# Patient Record
Sex: Male | Born: 1980 | Race: White | Hispanic: No | Marital: Single | Smoking: Never smoker
Health system: Southern US, Community
[De-identification: ages and names within clinical notes are randomized; demographics above are authoritative.]

## PROBLEM LIST (undated history)

## (undated) DIAGNOSIS — A630 Anogenital (venereal) warts: Secondary | ICD-10-CM

## (undated) DIAGNOSIS — F329 Major depressive disorder, single episode, unspecified: Secondary | ICD-10-CM

## (undated) DIAGNOSIS — F32A Depression, unspecified: Secondary | ICD-10-CM

## (undated) DIAGNOSIS — Z973 Presence of spectacles and contact lenses: Secondary | ICD-10-CM

## (undated) DIAGNOSIS — C801 Malignant (primary) neoplasm, unspecified: Secondary | ICD-10-CM

## (undated) DIAGNOSIS — F419 Anxiety disorder, unspecified: Secondary | ICD-10-CM

## (undated) HISTORY — DX: Major depressive disorder, single episode, unspecified: F32.9

## (undated) HISTORY — DX: Depression, unspecified: F32.A

---

## 2011-06-28 ENCOUNTER — Ambulatory Visit (INDEPENDENT_AMBULATORY_CARE_PROVIDER_SITE_OTHER): Payer: BC Managed Care – PPO | Admitting: Emergency Medicine

## 2011-06-28 VITALS — BP 133/79 | HR 83 | Temp 98.3°F | Resp 17 | Ht 70.5 in | Wt 136.0 lb

## 2011-06-28 DIAGNOSIS — A6000 Herpesviral infection of urogenital system, unspecified: Secondary | ICD-10-CM

## 2011-06-28 MED ORDER — VALACYCLOVIR HCL 1 G PO TABS: 1000.0000 mg | ORAL_TABLET | Freq: Every day | ORAL | Status: AC

## 2011-06-28 NOTE — Progress Notes (Signed)
     Patient Name: Arthur Park Date of Birth: 1980/11/29 Medical Record Number: [id number] Gender: male Date of Encounter: 06/28/2011  History of Present Illness:  Arthur Park is a 31 y.o. very pleasant male patient who presents with the following:  Perianal rash that is painful.  First noticed today.  No other rash or complaint.  Was tested for HIV and RPR in April and negative.  Practices anoreptive sex.  Recently had unprotected sex with a new partner.  There is no problem list on file for this patient.  No past medical history on file. No past surgical history on file. History  Substance Use Topics  . Smoking status: Never Smoker   . Smokeless tobacco: Not on file  . Alcohol Use: Not on file   No family history on file. No Known Allergies  Medication list has been reviewed and updated.  Prior to Admission medications   Medication Sig Start Date End Date Taking? Authorizing Provider  citalopram (CELEXA) 40 MG tablet Take 40 mg by mouth daily.   Yes Historical Provider, MD  valACYclovir (VALTREX) 1000 MG tablet Take 1 tablet (1,000 mg total) by mouth daily. 06/28/11 06/27/12  Phillips Odor, MD    Review of Systems:  As per HPI, otherwise negative.    Physical Examination: Filed Vitals:   06/28/11 1341  BP: 133/79  Pulse: 83  Temp: 98.3 F (36.8 C)  Resp: 17   Filed Vitals:   06/28/11 1341  Height: 5' 10.5" (1.791 m)  Weight: 136 lb (61.689 kg)   Body mass index is 19.24 kg/(m^2). Ideal Body Weight: Weight in (lb) to have BMI = 25: 176.4   GEN: WDWN, NAD, Non-toxic, Alert & Oriented x 3 HEENT: Atraumatic, Normocephalic.  Ears and Nose: No external deformity. EXTR: No clubbing/cyanosis/edema NEURO: Normal gait.  PSYCH: Normally interactive. Conversant. Not depressed or anxious appearing.  Calm demeanor.  Rectal:  Multiple discrete erythematous lesions in kissing pattern in perianal area   Assessment and Plan: Herpes  genitalis Valtrex Follow up as needed  Carmelina Dane, MD

## 2011-06-28 NOTE — Patient Instructions (Signed)
Genital Herpes Genital herpes is a sexually transmitted disease. This means that it is a disease passed by having sex with an infected person. There is no cure for genital herpes. The time between attacks can be months to years. The virus may live in a person but produce no problems (symptoms). This infection can be passed to a baby as it travels down the birth canal (vagina). In a newborn, this can cause central nervous system damage, eye damage, or even death. The virus that causes genital herpes is usually HSV-2 virus. The virus that causes oral herpes is usually HSV-1. The diagnosis (learning what is wrong) is made through culture results. SYMPTOMS  Usually symptoms of pain and itching begin a few days to a week after contact. It first appears as small blisters that progress to small painful ulcers which then scab over and heal after several days. It affects the outer genitalia, birth canal, cervix, penis, anal area, buttocks, and thighs. HOME CARE INSTRUCTIONS   Keep ulcerated areas dry and clean.   Take medications as directed. Antiviral medications can speed up healing. They will not prevent recurrences or cure this infection. These medications can also be taken for suppression if there are frequent recurrences.   While the infection is active, it is contagious. Avoid all sexual contact during active infections.   Condoms may help prevent spread of the herpes virus.   Practice safe sex.   Wash your hands thoroughly after touching the genital area.   Avoid touching your eyes after touching your genital area.   Inform your caregiver if you have had genital herpes and become pregnant. It is your responsibility to insure a safe outcome for your baby in this pregnancy.   Only take over-the-counter or prescription medicines for pain, discomfort, or fever as directed by your caregiver.  SEEK MEDICAL CARE IF:   You have a recurrence of this infection.   You do not respond to medications and  are not improving.   You have new sources of pain or discharge which have changed from the original infection.   You have an oral temperature above 102 F (38.9 C).   You develop abdominal pain.   You develop eye pain or signs of eye infection.  Document Released: 12/29/1999 Document Revised: 12/20/2010 Document Reviewed: 01/18/2009 Exit[employer] Patient Information 2012 [employer]. 

## 2011-07-01 ENCOUNTER — Telehealth: Payer: Self-pay | Admitting: *Deleted

## 2011-07-01 NOTE — Telephone Encounter (Signed)
Pt would like lab results.  Can you review labs?

## 2012-08-27 ENCOUNTER — Ambulatory Visit (INDEPENDENT_AMBULATORY_CARE_PROVIDER_SITE_OTHER): Payer: BC Managed Care – PPO | Admitting: Family Medicine

## 2012-08-27 VITALS — BP 132/82 | HR 107 | Temp 98.3°F | Resp 16 | Ht 70.0 in | Wt 148.0 lb

## 2012-08-27 DIAGNOSIS — Z2089 Contact with and (suspected) exposure to other communicable diseases: Secondary | ICD-10-CM

## 2012-08-27 DIAGNOSIS — K6289 Other specified diseases of anus and rectum: Secondary | ICD-10-CM

## 2012-08-27 DIAGNOSIS — Z202 Contact with and (suspected) exposure to infections with a predominantly sexual mode of transmission: Secondary | ICD-10-CM

## 2012-08-27 DIAGNOSIS — K629 Disease of anus and rectum, unspecified: Secondary | ICD-10-CM

## 2012-08-27 DIAGNOSIS — Z7251 High risk heterosexual behavior: Secondary | ICD-10-CM

## 2012-08-27 NOTE — Progress Notes (Signed)
  Subjective:    Patient ID: Arthur Park, male    DOB: 1980/04/22, 32 y.o.   MRN: [id number]  HPI Arthur Park is a 32 y.o. male  Showering earlier after workout - cardio only. No straining.  Noticed bumps in anal area. Not sore. No heavy lifting. Possible herpes last June - different bumps then - itchy. Took valtrex until negative HSV test.  No recent illness.   3 sexual partners in past year, same partner until April of this year - some unprotected intercourse then.. Male partners only. Normal/negative HIV, RPR, chlamydia/gonorrhea June 9th.   2 new partners since, with anal intercourse, but condoms each time. Negative gonorrhea/chlamydia test in July when had testicular pain - resolved.   English professor.  Nonsmoker. Rare alcohol.   Review of Systems  Constitutional: Negative for fever, chills, diaphoresis, activity change and unexpected weight change.  Gastrointestinal: Negative for vomiting, abdominal pain, constipation (few days past week. last BM - today - hard stool. slight straining. ), blood in stool, abdominal distention, anal bleeding and rectal pain.  Genitourinary: Negative for dysuria, frequency, discharge, penile swelling, scrotal swelling, difficulty urinating, genital sores and testicular pain.       Objective:   Physical Exam  Vitals reviewed. HENT:  Head: Normocephalic and atraumatic.  Pulmonary/Chest: Effort normal.  Abdominal: Hernia confirmed negative in the right inguinal area and confirmed negative in the left inguinal area.  Genitourinary: Testes normal and penis normal. Rectal exam shows no external hemorrhoid, no internal hemorrhoid and no fissure. Right testis shows no tenderness. Left testis shows no tenderness. No penile erythema or penile tenderness. No discharge found.     Skin: Skin is warm and dry. No rash noted.  Psychiatric: He has a normal mood and affect. His behavior is normal.   Verbal consent obtained after any  questions were answered for bx/removal of one of perianal lesions  Area cleansed with Betadine x2, small tag vs verrucal lesion  At 3 oclock grasped with forceps, removed with iris scissors flat to skin. Minimal ooze of blood - hemostatic with pressure only.  No complications.    Assessment & Plan:  Arthur Park is a 32 y.o. male  Problems related to high-risk sexual behavior, with new partners, and prior unprotected intercourse. Plan: HIV antibody, RPR, GC/Chlamydia Probe Amp, Hepatitis C antibody, HSV(herpes simplex vrs) 1+2 ab-IgG, Hepatitis B surface antibody, Hepatitis B surface antigen.  Perianal lesions - skin tags possible but concerning for condyloma. Biopsy/1 lesion removed and sent for Dermatology pathology, if condyloma, will discuss treatment options.  Contact precautions and safer sex practices discussed.   Meds ordered this encounter  Medications  . buPROPion (WELLBUTRIN XL) 300 MG 24 hr tablet    Sig: Take 300 mg by mouth daily.  Marland Kitchen gabapentin (NEURONTIN) 300 MG capsule    Sig: Take 300 mg by mouth 3 (three) times daily.   Patient Instructions  You should receive a call or letter about your lab results within the next week to 10 days.  To look up more info on your condition, go to the website [email or web address], then on patient resources - select UPTODATE, but we can discuss diagnosis and treatment further once results have returned.  Return to the clinic or go to the nearest emergency room if any of your symptoms worsen or new symptoms occur.

## 2012-08-27 NOTE — Patient Instructions (Signed)
You should receive a call or letter about your lab results within the next week to 10 days.  To look up more info on your condition, go to the website [email or web address], then on patient resources - select UPTODATE, but we can discuss diagnosis and treatment further once results have returned.  Return to the clinic or go to the nearest emergency room if any of your symptoms worsen or new symptoms occur.

## 2012-08-29 LAB — HIV ANTIBODY (ROUTINE TESTING W REFLEX): HIV: NONREACTIVE

## 2012-08-29 LAB — HEPATITIS C ANTIBODY: HCV Ab: NEGATIVE

## 2012-08-29 LAB — HEPATITIS B SURFACE ANTIBODY, QUANTITATIVE: Hepatitis B-Post: 575 m[IU]/mL

## 2012-08-31 LAB — HSV(HERPES SIMPLEX VRS) I + II AB-IGG: HSV 1 Glycoprotein G Ab, IgG: 0.33 IV

## 2012-09-07 ENCOUNTER — Other Ambulatory Visit: Payer: Self-pay | Admitting: Family Medicine

## 2012-09-07 ENCOUNTER — Telehealth: Payer: Self-pay | Admitting: Radiology

## 2012-09-07 DIAGNOSIS — A630 Anogenital (venereal) warts: Secondary | ICD-10-CM

## 2012-09-07 MED ORDER — IMIQUIMOD 5 % EX CREA: TOPICAL_CREAM | CUTANEOUS | Status: DC

## 2012-09-07 NOTE — Telephone Encounter (Signed)
Patient called on his cell [phone] you can call any time to speak to him, if you need me to call I can.

## 2012-09-17 ENCOUNTER — Telehealth: Payer: Self-pay

## 2012-09-17 NOTE — Telephone Encounter (Signed)
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 He now has an area above his eyelid (looks like a wart ) he wants to know if he can get these at his eyelid.

## 2012-09-17 NOTE — Telephone Encounter (Signed)
Pt is calling to speak to Dr Neva Seat he has follow up question about his last visit  Call back number is [phone]

## 2012-09-18 NOTE — Telephone Encounter (Signed)
Less likely in this area, but if new area of concern, would recommend rtc to check this as could just be skin tag.

## 2012-09-19 NOTE — Telephone Encounter (Signed)
Spoke with pt he saw an optometrist and it seems to be a mucus plug.

## 2012-10-01 ENCOUNTER — Telehealth: Payer: Self-pay

## 2012-10-01 NOTE — Telephone Encounter (Signed)
Noted. Called pt - Aldara is causing redness and irritation. Does not think is receeding, but hard to tell if improving d/t redness of area.  Applying aldara overnight 3 times per week and rinsing in am.  Now 3 weeks into tx.  Plans to stop for 1 week , then retr, but then if unable to tolerate - would refer to Derm for other mgt options or in office treatments.

## 2012-10-01 NOTE — Telephone Encounter (Signed)
Patient wants to stop taking Aldara. States that it is making him more irritated and it is not helping.  [phone].

## 2013-01-18 ENCOUNTER — Encounter: Payer: Self-pay | Admitting: Family Medicine

## 2013-01-18 ENCOUNTER — Ambulatory Visit (INDEPENDENT_AMBULATORY_CARE_PROVIDER_SITE_OTHER): Payer: BC Managed Care – PPO | Admitting: Family Medicine

## 2013-01-18 VITALS — BP 128/94 | HR 101 | Temp 98.4°F | Resp 16 | Ht 69.75 in | Wt 146.4 lb

## 2013-01-18 DIAGNOSIS — G4700 Insomnia, unspecified: Secondary | ICD-10-CM

## 2013-01-18 DIAGNOSIS — Z202 Contact with and (suspected) exposure to infections with a predominantly sexual mode of transmission: Secondary | ICD-10-CM

## 2013-01-18 DIAGNOSIS — Z2089 Contact with and (suspected) exposure to other communicable diseases: Secondary | ICD-10-CM

## 2013-01-18 DIAGNOSIS — A630 Anogenital (venereal) warts: Secondary | ICD-10-CM

## 2013-01-18 NOTE — Progress Notes (Signed)
Subjective:    Patient ID: Arthur Park, male    DOB: [birth year], 33 y.o.   MRN: [id number]  Arthur Park is a 33 y.o. male "Arthur Park"  See last ov 08/27/12, then phone calls since.  Had STI testing at that ov: Results for orders placed in visit on 08/27/12  GC/CHLAMYDIA PROBE AMP      Result Value Range   CT Probe RNA NEGATIVE     GC Probe RNA NEGATIVE    HIV ANTIBODY (ROUTINE TESTING)      Result Value Range   HIV NON REACTIVE  NON REACTIVE  RPR      Result Value Range   RPR NON REAC  NON REAC  HEPATITIS C ANTIBODY      Result Value Range   HCV Ab NEGATIVE  NEGATIVE  HSV(HERPES SIMPLEX VRS) I + II AB-IGG      Result Value Range   HSV 1 Glycoprotein G Ab, IgG 0.33     HSV 2 Glycoprotein G Ab, IgG <0.10    HEPATITIS B SURFACE ANTIBODY, QUANTITATIVE      Result Value Range   Hepatitis B-Post 575.0    HEPATITIS B SURFACE ANTIGEN      Result Value Range   Hepatitis B Surface Ag NEGATIVE  NEGATIVE    Diagnosed with perianal condyloma, rx Aldara, had some irritation - instructed to stop for 1 weeks then try restart on phone msg 10/01/12.   Here for follow up. Took Aldara for total of 16 weeks (4 weeks, 1 week break, 8 weeks  Few day break d/t redness, then another 4 weeks). Finished around Christmas. Last checked area few days ago - feels like outside is ok, but sees some areas closer to anus.  Not sure if cream made it to those internal ones beyond 4 weeks.   Oral sex with one new partner since last STI testing, no semen transmission. No penetrative or anal intercourse. Same sex partner. Would like repeat testing, as above. No new sx's, no penile d/c, no testicular pain, no anal bleeding or d/c from lesions around anus. Only irritation when taking Aldara. No other new rash.   At end of visit - states not sleeping well past week. . Trouble falling asleep at times, then wakes up at times. Followed by Arthur Park for anxiety - takes Neurontin  3 times per day for  anxiety to help sleep. Melatonin helped get to sleep. Exercises 4 times per week. No afternoon caffeine. No new stressors.   Review of Systems  Constitutional: Negative for fever, chills and unexpected weight change.  Genitourinary: Negative for dysuria, hematuria, difficulty urinating, genital sores, penile pain and testicular pain.  Skin: Positive for rash (anal only. ).       Objective:   Physical Exam  Vitals reviewed. Constitutional: He is oriented to person, place, and time. He appears well-developed and well-nourished. No distress.  HENT:  Head: Normocephalic and atraumatic.  Pulmonary/Chest: Effort normal.  Genitourinary:     Neurological: He is alert and oriented to person, place, and time.  Skin: Skin is warm and dry. No rash noted.  Psychiatric: He has a normal mood and affect. His behavior is normal.       Assessment & Plan:   Arthur Park is a 33 y.o. 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   Insomnia- already on neurontin and wellbutrin for underlying anxiety.  Sleep hygiene reviewed, utd for other info, and if not helping, may need to call his psychiatric provider to discuss dosing change of neurontin or eval.   Exposure to STD - Plan: Hepatitis C antibody, RPR, GC/Chlamydia Amp Probe, Genital, HIV antibody, HSV(herpes simplex vrs) 1+2 ab-IgG - sti testing repeated as new partner since last testing.    Meds ordered this encounter  Medications  . Multiple Vitamins-Minerals (MULTIVITAMIN WITH MINERALS) tablet    Sig: Take 1 tablet by mouth daily.  . vitamin A 32549 UNIT capsule    Sig: Take 10,000 Units by mouth daily.  . Ascorbic Acid (VITAMIN C) 100 MG tablet    Sig: Take 100 mg by mouth daily.  Marland Kitchen zinc gluconate 50 MG tablet    Sig: Take 50 mg by  mouth daily.   Patient Instructions  We will refer you to dermatology to discuss options for the internal rash. You should receive a call or letter about your lab results within the next week to 10 days.  Work on sleep hints as discussed, and see information from uptodate and below. If this persists - return to discuss or let your provider at [facility] know.  To look up more info on your condition, go to the website [email or web address], then on patient resources - select UPTODATE. Under patient resources, select Insomnia Insomnia Insomnia is frequent trouble falling and/or staying asleep. Insomnia can be a long term problem or a short term problem. Both are common. Insomnia can be a short term problem when the wakefulness is related to a certain stress or worry. Long term insomnia is often related to ongoing stress during waking hours and/or poor sleeping habits. Overtime, sleep deprivation itself can make the problem worse. Every little thing feels more severe because you are overtired and your ability to cope is decreased. CAUSES   Stress, anxiety, and depression.  Poor sleeping habits.  Distractions such as TV in the bedroom.  Naps close to bedtime.  Engaging in emotionally charged conversations before bed.  Technical reading before sleep.  Alcohol and other sedatives. They may make the problem worse. They can hurt normal sleep patterns and normal dream activity.  Stimulants such as caffeine for several hours prior to bedtime.  Pain syndromes and shortness of breath can cause insomnia.  Exercise late at night.  Changing time zones may cause sleeping problems (jet lag). It is sometimes helpful to have someone observe your sleeping patterns. They should look for periods of not breathing during the night (sleep apnea). They should also look to see how long those periods last. If you live alone or observers are uncertain, you can also be observed at a sleep clinic where your sleep patterns  will be professionally monitored. Sleep apnea requires a checkup and treatment. Give your caregivers your medical history. Give your caregivers observations your family has made about your sleep.  SYMPTOMS   Not feeling rested in the morning.  Anxiety and restlessness at bedtime.  Difficulty falling and staying asleep. TREATMENT   Your caregiver may prescribe treatment for an underlying medical disorders. Your caregiver can give advice or help if you are using alcohol or other drugs for self-medication. Treatment of underlying problems will usually eliminate insomnia problems.  Medications can be prescribed for short time use. They are generally not recommended for lengthy use.  Over-the-counter sleep medicines are not recommended for lengthy use. They can be habit forming.  You can promote easier sleeping by making lifestyle changes such as:  Using relaxation techniques that help with breathing and reduce muscle tension.  Exercising earlier in the day.  Changing your diet and the time of your last meal. No night time snacks.  Establish a regular time to go to bed.  Counseling can help with stressful problems and worry.  Soothing music and white noise may be helpful if there are background noises you cannot remove.  Stop tedious detailed work at least one hour before bedtime. HOME CARE INSTRUCTIONS   Keep a diary. Inform your caregiver about your progress. This includes any medication side effects. See your caregiver regularly. Take note of:  Times when you are asleep.  Times when you are awake during the night.  The quality of your sleep.  How you feel the next day. This information will help your caregiver care for you.  Get out of bed if you are still awake after 15 minutes. Read or do some quiet activity. Keep the lights down. Wait until you feel sleepy and go back to bed.  Keep regular sleeping and waking hours. Avoid naps.  Exercise regularly.  Avoid  distractions at bedtime. Distractions include watching television or engaging in any intense or detailed activity like attempting to balance the household checkbook.  Develop a bedtime ritual. Keep a familiar routine of bathing, brushing your teeth, climbing into bed at the same time each night, listening to soothing music. Routines increase the success of falling to sleep faster.  Use relaxation techniques. This can be using breathing and muscle tension release routines. It can also include visualizing peaceful scenes. You can also help control troubling or intruding thoughts by keeping your mind occupied with boring or repetitive thoughts like the old concept of counting sheep. You can make it more creative like imagining planting one beautiful flower after another in your backyard garden.  During your day, work to eliminate stress. When this is not possible use some of the previous suggestions to help reduce the anxiety that accompanies stressful situations. MAKE SURE YOU:   Understand these instructions.  Will watch your condition.  Will get help right away if you are not doing well or get worse. Document Released: 12/29/1999 Document Revised: 03/25/2011 Document Reviewed: 01/28/2007 [facility] Patient Information 2014 [place].

## 2013-01-18 NOTE — Patient Instructions (Signed)
We will refer you to dermatology to discuss options for the internal rash. You should receive a call or letter about your lab results within the next week to 10 days.  Work on sleep hints as discussed, and see information from uptodate and below. If this persists - return to discuss or let your provider at [facility] know.  To look up more info on your condition, go to the website [email or web address], then on patient resources - select UPTODATE. Under patient resources, select Insomnia Insomnia Insomnia is frequent trouble falling and/or staying asleep. Insomnia can be a long term problem or a short term problem. Both are common. Insomnia can be a short term problem when the wakefulness is related to a certain stress or worry. Long term insomnia is often related to ongoing stress during waking hours and/or poor sleeping habits. Overtime, sleep deprivation itself can make the problem worse. Every little thing feels more severe because you are overtired and your ability to cope is decreased. CAUSES   Stress, anxiety, and depression.  Poor sleeping habits.  Distractions such as TV in the bedroom.  Naps close to bedtime.  Engaging in emotionally charged conversations before bed.  Technical reading before sleep.  Alcohol and other sedatives. They may make the problem worse. They can hurt normal sleep patterns and normal dream activity.  Stimulants such as caffeine for several hours prior to bedtime.  Pain syndromes and shortness of breath can cause insomnia.  Exercise late at night.  Changing time zones may cause sleeping problems (jet lag). It is sometimes helpful to have someone observe your sleeping patterns. They should look for periods of not breathing during the night (sleep apnea). They should also look to see how long those periods last. If you live alone or observers are uncertain, you can also be observed at a sleep clinic where your sleep patterns will be professionally monitored. Sleep  apnea requires a checkup and treatment. Give your caregivers your medical history. Give your caregivers observations your family has made about your sleep.  SYMPTOMS   Not feeling rested in the morning.  Anxiety and restlessness at bedtime.  Difficulty falling and staying asleep. TREATMENT   Your caregiver may prescribe treatment for an underlying medical disorders. Your caregiver can give advice or help if you are using alcohol or other drugs for self-medication. Treatment of underlying problems will usually eliminate insomnia problems.  Medications can be prescribed for short time use. They are generally not recommended for lengthy use.  Over-the-counter sleep medicines are not recommended for lengthy use. They can be habit forming.  You can promote easier sleeping by making lifestyle changes such as:  Using relaxation techniques that help with breathing and reduce muscle tension.  Exercising earlier in the day.  Changing your diet and the time of your last meal. No night time snacks.  Establish a regular time to go to bed.  Counseling can help with stressful problems and worry.  Soothing music and white noise may be helpful if there are background noises you cannot remove.  Stop tedious detailed work at least one hour before bedtime. HOME CARE INSTRUCTIONS   Keep a diary. Inform your caregiver about your progress. This includes any medication side effects. See your caregiver regularly. Take note of:  Times when you are asleep.  Times when you are awake during the night.  The quality of your sleep.  How you feel the next day. This information will help your caregiver care for you.  Get out of  bed if you are still awake after 15 minutes. Read or do some quiet activity. Keep the lights down. Wait until you feel sleepy and go back to bed.  Keep regular sleeping and waking hours. Avoid naps.  Exercise regularly.  Avoid distractions at bedtime. Distractions include  watching television or engaging in any intense or detailed activity like attempting to balance the household checkbook.  Develop a bedtime ritual. Keep a familiar routine of bathing, brushing your teeth, climbing into bed at the same time each night, listening to soothing music. Routines increase the success of falling to sleep faster.  Use relaxation techniques. This can be using breathing and muscle tension release routines. It can also include visualizing peaceful scenes. You can also help control troubling or intruding thoughts by keeping your mind occupied with boring or repetitive thoughts like the old concept of counting sheep. You can make it more creative like imagining planting one beautiful flower after another in your backyard garden.  During your day, work to eliminate stress. When this is not possible use some of the previous suggestions to help reduce the anxiety that accompanies stressful situations. MAKE SURE YOU:   Understand these instructions.  Will watch your condition.  Will get help right away if you are not doing well or get worse. Document Released: 12/29/1999 Document Revised: 03/25/2011 Document Reviewed: 01/28/2007 [facility] Patient Information 2014 [place].

## 2013-01-19 LAB — HIV ANTIBODY (ROUTINE TESTING W REFLEX): HIV: NONREACTIVE

## 2013-01-19 LAB — RPR

## 2013-01-19 LAB — HEPATITIS C ANTIBODY: HCV AB: NEGATIVE

## 2013-01-20 LAB — GC/CHLAMYDIA PROBE AMP
CT PROBE, AMP APTIMA: NEGATIVE
GC Probe RNA: NEGATIVE

## 2013-01-20 LAB — HSV(HERPES SIMPLEX VRS) I + II AB-IGG
HSV 1 Glycoprotein G Ab, IgG: <0.1 IV
HSV 2 Glycoprotein G Ab, IgG: 0.15 IV

## 2013-01-22 ENCOUNTER — Encounter: Payer: Self-pay | Admitting: Family Medicine

## 2013-01-22 DIAGNOSIS — A630 Anogenital (venereal) warts: Secondary | ICD-10-CM

## 2013-01-22 NOTE — Telephone Encounter (Signed)
Reply was sent, this is [facility]

## 2013-01-25 NOTE — Addendum Note (Signed)
Addended byCandice Camp on: 01/25/2013 02:10 PM   Modules accepted: Orders

## 2013-01-25 NOTE — Telephone Encounter (Signed)
GI has indicated patient should see a surgeon, do you want to refer to [facility]? Pended.

## 2013-01-29 NOTE — Addendum Note (Signed)
Addended by: Merri Ray R on: 01/29/2013 10:05 PM   Modules accepted: Orders

## 2013-01-29 NOTE — Telephone Encounter (Signed)
Yes, colorectal surgeon may be best option.   Pease let him know I am sorry he has been referred various places to try to get this treated.

## 2013-02-03 ENCOUNTER — Encounter (INDEPENDENT_AMBULATORY_CARE_PROVIDER_SITE_OTHER): Payer: Self-pay | Admitting: General Surgery

## 2013-02-03 ENCOUNTER — Ambulatory Visit (INDEPENDENT_AMBULATORY_CARE_PROVIDER_SITE_OTHER): Payer: BC Managed Care – PPO | Admitting: General Surgery

## 2013-02-03 VITALS — BP 124/82 | HR 68 | Temp 97.8°F | Resp 14 | Ht 70.0 in | Wt 150.8 lb

## 2013-02-03 DIAGNOSIS — A630 Anogenital (venereal) warts: Secondary | ICD-10-CM | POA: Insufficient documentation

## 2013-02-03 NOTE — Patient Instructions (Signed)
Genital Warts Genital warts are a sexually transmitted infection. They may appear as small bumps on the tissues of the genital area. CAUSES  Genital warts are caused by a virus called human papillomavirus (HPV). HPV is the most common sexually transmitted disease (STD) and infection of the sex organs. This infection is spread by having unprotected sex with an infected person. It can be spread by vaginal, anal, and oral sex. Many people do not know they are infected. They may be infected for years without problems. However, even if they do not have problems, they can unknowingly pass the infection to their sexual partners. SYMPTOMS   Itching and irritation in the genital area.  Warts that bleed.  Painful sexual intercourse. DIAGNOSIS  Warts are usually recognized with the naked eye on the vagina, vulva, perineum, anus, and rectum. Certain tests can also diagnose genital warts, such as:  A Pap test.  A tissue sample (biopsy) exam.  Colposcopy. A magnifying tool is used to examine the vagina and cervix. The HPV cells will change color when certain solutions are used. TREATMENT  Warts can be removed by:  Applying certain chemicals, such as cantharidin or podophyllin.  Liquid nitrogen freezing (cryotherapy).  Immunotherapy with candida or trichophyton injections.  Laser treatment.  Burning with an electrified probe (electrocautery).  Interferon injections.  Surgery. PREVENTION  HPV vaccination can help prevent HPV infections that cause genital warts and that cause cancer of the cervix. It is recommended that the vaccination be given to people between the ages 9 to 26 years old. The vaccine might not work as well or might not work at all if you already have HPV. It should not be given to pregnant women. HOME CARE INSTRUCTIONS   It is important to follow your caregiver's instructions. The warts will not go away without treatment. Repeat treatments are often needed to get rid of warts.  Even after it appears that the warts are gone, the normal tissue underneath often remains infected.  Do not try to treat genital warts with medicine used to treat hand warts. This type of medicine is strong and can burn the skin in the genital area, causing more damage.  Tell your past and current sexual partner(s) that you have genital warts. They may be infected also and need treatment.  Avoid sexual contact while being treated.  Do not touch or scratch the warts. The infection may spread to other parts of your body.  Women with genital warts should have a cervical cancer check (Pap test) at least once a year. This type of cancer is slow-growing and can be cured if found early. Chances of developing cervical cancer are increased with HPV.  Inform your obstetrician about your warts in the event of pregnancy. This virus can be passed to the baby's respiratory tract. Discuss this with your caregiver.  Use a condom during sexual intercourse. Following treatment, the use of condoms will help prevent reinfection.  Ask your caregiver about using over-the-counter anti-itch creams. SEEK MEDICAL CARE IF:   Your treated skin becomes red, swollen, or painful.  You have a fever.  You feel generally ill.  You feel little lumps in and around your genital area.  You are bleeding or have painful sexual intercourse. MAKE SURE YOU:   Understand these instructions.  Will watch your condition.  Will get help right away if you are not doing well or get worse. Document Released: 12/29/1999 Document Revised: 03/25/2011 Document Reviewed: 07/09/2010 Exit[employer] Patient Information 2014 [employer].  

## 2013-02-03 NOTE — Progress Notes (Signed)
Patient ID: Arthur Mantia, male   DOB: 01/04/1981, 33 y.o.   MRN: [id number]  Chief Complaint  Patient presents with  . New Evaluation    eval anal warts    HPI Arthur Park is a 33 y.o. male.   HPI 33-year-old Caucasian male referred by Dr. Jeffrey Greene for evaluation of anal warts.The patient has been dealing with perianal warts for several months now. He initially was started on Aldara ointment that he was applying 3 nights a week. He did 16 weeks of Aldara treatment. He had excellent response to the perianal skin lesions; however, there is concern that he may have internal anal warts. He states he is HIV negative. He identifies himself as homosexual. He is not sexually active currently. He denies any fever, chills, nausea, vomiting, diarrhea or constipation. He denies any melena or hematochezia. He denies any incontinence. He denies any pain with defecation. Past Medical History  Diagnosis Date  . Anemia   . Depression     History reviewed. No pertinent past surgical history.  Family History  Problem Relation Age of Onset  . Depression Mother   . Drug abuse Brother   . Cancer Father     prostate & thyroid    Social History History  Substance Use Topics  . Smoking status: Never Smoker   . Smokeless tobacco: Never Used  . Alcohol Use: Yes     Comment: 3 x week    No Known Allergies  Current Outpatient Prescriptions  Medication Sig Dispense Refill  . Ascorbic Acid (VITAMIN Arthur) 100 MG tablet Take 100 mg by mouth daily.      . buPROPion (WELLBUTRIN XL) 300 MG 24 hr tablet Take 300 mg by mouth daily.      . gabapentin (NEURONTIN) 300 MG capsule Take 300 mg by mouth 3 (three) times daily.      . Multiple Vitamins-Minerals (MULTIVITAMIN WITH MINERALS) tablet Take 1 tablet by mouth daily.      . vitamin A 10000 UNIT capsule Take 10,000 Units by mouth daily.      . zinc gluconate 50 MG tablet Take 50 mg by mouth daily.       No current facility-administered medications for this  visit.    Review of Systems Review of Systems  Constitutional: Negative for fever, chills, appetite change and unexpected weight change.  HENT: Negative for congestion and trouble swallowing.   Eyes: Negative for visual disturbance.  Respiratory: Negative for chest tightness and shortness of breath.   Cardiovascular: Negative for chest pain and leg swelling.       No PND, no orthopnea, no DOE  Gastrointestinal:       See HPI  Genitourinary: Negative for dysuria and hematuria.  Musculoskeletal: Negative.   Skin: Negative for rash.  Neurological: Negative for seizures and speech difficulty.  Hematological: Does not bruise/bleed easily.  Psychiatric/Behavioral: Negative for behavioral problems and confusion.    Blood pressure 124/82, pulse 68, temperature 97.8 F (36.6 Arthur), temperature source Temporal, resp. rate 14, height 5' 10" (1.778 m), weight 150 lb 12.8 oz (68.402 kg).  Physical Exam Physical Exam  Constitutional: He is oriented to person, place, and time. He appears well-developed and well-nourished. No distress.  HENT:  Head: Normocephalic and atraumatic.  Right Ear: External ear normal.  Left Ear: External ear normal.  Eyes: Conjunctivae are normal. No scleral icterus.  Neck: Normal range of motion. Neck supple. No tracheal deviation present. No thyromegaly present.  Cardiovascular: Normal rate, normal heart sounds   and intact distal pulses.   Pulmonary/Chest: Effort normal and breath sounds normal. No respiratory distress. He has no wheezes.  Abdominal: Soft. He exhibits no distension. There is no tenderness. There is no rebound.  Genitourinary: Rectal exam shows no external hemorrhoid and anal tone normal.  Visual inspection of his perineum reveals some hyperemic perianal skin; however it does not blanch. Digital rectal exam reveals good tone. Anoscopy was performed which demonstrated spotty circumferential small warts just inside the anal verge to the dentate line.  Perhaps a grade 1 internal hemorrhoid in the left lateral position  Musculoskeletal: Normal range of motion. He exhibits no edema and no tenderness.  Lymphadenopathy:    He has no cervical adenopathy.  Neurological: He is alert and oriented to person, place, and time. He exhibits normal muscle tone.  Skin: Skin is warm and dry. No rash noted. He is not diaphoretic. No erythema. No pallor.  Psychiatric: He has a normal mood and affect. His behavior is normal. Judgment and thought content normal.    Data Reviewed Dr Vonna Kotyk office note  Assessment    Anal canal condyloma     Plan    I discussed the etiology of anal warts. I explained that the warts are caused by a sexually transmitted virus called HPV. I explained that there are different subtypes of HPV some of which are low risk and some of which are high-risk for the development of cancers. I explained that the majority of anal warts are associated with the low risk HPV.  We discussed how condyloma is related to sexual activity and how it can be spread.   I discussed the importance of testing for HIV. The patient has had a recent HIV test and is negative.   We discussed the management of anal warts. We discussed chemical destruction, immunotherapy, and surgical excision including laser therapy. I discussed the pros and cons of each approach. We discussed the risk and benefits and the expected outcome with chemical destruction with agents such as podophyllin. I explained that podophyllin is generally not been effective and has a high recurrence rate.  We then discussed surgical excision specifically excision and fulguration and laser therapy. I explained how the surgery is performed. I explained that it can be painful however it generally has the highest success rate. We discussed the risk and benefits of surgery including but not limited to bleeding, infection, injury to surrounding structures, need to do a formal anoscopic exam to evaluate  for anal canal warts, urinary retention, wart recurrence, and general anesthesia risk. We discussed the typical aftercare.  He has had excellent response to his external warts with Aldara. However he has persistent circumferential anal canal warts. At this point I've recommended surgery. He is elected to proceed with exam under anesthesia, and laser fulguration. I explained that Dr. Marcello Moores would be assisting me because we would be using the laser.  He was instructed do a fleets enema the morning of surgery. I want to wait at least an additional 2 weeks before getting him in the operating room in order to allow the irritation around his perineum to completely subside.  [name]. [name], MD, FACS General, Bariatric, & Minimally Invasive Surgery [facility] Surgery, [place]        [facility] M 02/03/2013, 11:19 AM

## 2013-02-12 ENCOUNTER — Encounter (HOSPITAL_BASED_OUTPATIENT_CLINIC_OR_DEPARTMENT_OTHER): Payer: Self-pay | Admitting: *Deleted

## 2013-02-15 ENCOUNTER — Encounter (HOSPITAL_BASED_OUTPATIENT_CLINIC_OR_DEPARTMENT_OTHER): Payer: Self-pay | Admitting: *Deleted

## 2013-02-15 NOTE — Progress Notes (Signed)
NPO AFTER MN. ARRIVE AT 1030. PT TO GET CBC DIFF DONE ON Wednesday 02-17-2013 OR Friday 02-19-2013.  ARRIVE AT 1030. WILL TAKE WELLBUTRIN AND GABAPENTIN AM DOS W/ SIPS OF WATER  AND DO FLEET ENEMA .

## 2013-02-17 LAB — CBC WITH DIFFERENTIAL/PLATELET
Basophils Absolute: 0 10*3/uL (ref 0.0–0.1)
Basophils Relative: 1 % (ref 0–1)
EOS ABS: 0.1 10*3/uL (ref 0.0–0.7)
Eosinophils Relative: 1 % (ref 0–5)
HCT: 42.3 % (ref 39.0–52.0)
HEMOGLOBIN: 14.8 g/dL (ref 13.0–17.0)
LYMPHS ABS: 2.2 10*3/uL (ref 0.7–4.0)
Lymphocytes Relative: 32 % (ref 12–46)
MCH: 29 pg (ref 26.0–34.0)
MCHC: 35 g/dL (ref 30.0–36.0)
MCV: 82.9 fL (ref 78.0–100.0)
MONOS PCT: 7 % (ref 3–12)
Monocytes Absolute: 0.5 10*3/uL (ref 0.1–1.0)
NEUTROS ABS: 4.1 10*3/uL (ref 1.7–7.7)
Neutrophils Relative %: 59 % (ref 43–77)
Platelets: 198 10*3/uL (ref 150–400)
RBC: 5.1 MIL/uL (ref 4.22–5.81)
RDW: 13.2 % (ref 11.5–15.5)
WBC: 7 10*3/uL (ref 4.0–10.5)

## 2013-02-22 ENCOUNTER — Encounter (HOSPITAL_BASED_OUTPATIENT_CLINIC_OR_DEPARTMENT_OTHER): Payer: Self-pay | Admitting: *Deleted

## 2013-02-22 ENCOUNTER — Ambulatory Visit (HOSPITAL_BASED_OUTPATIENT_CLINIC_OR_DEPARTMENT_OTHER): Payer: BC Managed Care – PPO | Admitting: Anesthesiology

## 2013-02-22 ENCOUNTER — Encounter (HOSPITAL_BASED_OUTPATIENT_CLINIC_OR_DEPARTMENT_OTHER): Payer: BC Managed Care – PPO | Admitting: Anesthesiology

## 2013-02-22 ENCOUNTER — Encounter (HOSPITAL_BASED_OUTPATIENT_CLINIC_OR_DEPARTMENT_OTHER)
Admission: RE | Disposition: A | Discharge status: Home / Self Care | Payer: Self-pay | Source: Ambulatory Visit | Attending: General Surgery

## 2013-02-22 ENCOUNTER — Ambulatory Visit (HOSPITAL_BASED_OUTPATIENT_CLINIC_OR_DEPARTMENT_OTHER)
Admission: RE | Admit: 2013-02-22 | Discharge: 2013-02-22 | Disposition: A | Discharge status: Home / Self Care | Payer: BC Managed Care – PPO | Source: Ambulatory Visit | Attending: General Surgery | Admitting: General Surgery

## 2013-02-22 DIAGNOSIS — Z79899 Other long term (current) drug therapy: Secondary | ICD-10-CM | POA: Insufficient documentation

## 2013-02-22 DIAGNOSIS — B078 Other viral warts: Secondary | ICD-10-CM | POA: Insufficient documentation

## 2013-02-22 DIAGNOSIS — F329 Major depressive disorder, single episode, unspecified: Secondary | ICD-10-CM | POA: Insufficient documentation

## 2013-02-22 DIAGNOSIS — D649 Anemia, unspecified: Secondary | ICD-10-CM | POA: Insufficient documentation

## 2013-02-22 DIAGNOSIS — F3289 Other specified depressive episodes: Secondary | ICD-10-CM | POA: Insufficient documentation

## 2013-02-22 DIAGNOSIS — A630 Anogenital (venereal) warts: Secondary | ICD-10-CM

## 2013-02-22 HISTORY — PX: LASER ABLATION CONDOLAMATA: SHX5941

## 2013-02-22 HISTORY — DX: Anogenital (venereal) warts: A63.0

## 2013-02-22 HISTORY — DX: Presence of spectacles and contact lenses: Z97.3

## 2013-02-22 HISTORY — DX: Anxiety disorder, unspecified: F41.9

## 2013-02-22 SURGERY — ABLATION, CONDYLOMA, USING LASER
Anesthesia: General | Site: Rectum

## 2013-02-22 MED ORDER — BUPIVACAINE LIPOSOME 1.3 % IJ SUSP
INTRAMUSCULAR | Status: DC | PRN
  Administered 2013-02-22: 20 mL

## 2013-02-22 MED ORDER — ACETAMINOPHEN 650 MG RE SUPP
650.0000 mg | RECTAL | Status: DC | PRN
  Filled 2013-02-22: qty 1

## 2013-02-22 MED ORDER — FENTANYL CITRATE 0.05 MG/ML IJ SOLN
INTRAMUSCULAR | Status: DC | PRN
  Administered 2013-02-22 (×4): 12.5 ug via INTRAVENOUS
  Administered 2013-02-22: 100 ug via INTRAVENOUS

## 2013-02-22 MED ORDER — FLEET ENEMA 7-19 GM/118ML RE ENEM
1.0000 | ENEMA | Freq: Once | RECTAL | Status: DC
  Filled 2013-02-22: qty 1

## 2013-02-22 MED ORDER — SUCCINYLCHOLINE CHLORIDE 20 MG/ML IJ SOLN
INTRAMUSCULAR | Status: DC | PRN
  Administered 2013-02-22: 100 mg via INTRAVENOUS

## 2013-02-22 MED ORDER — OXYCODONE-ACETAMINOPHEN 5-325 MG PO TABS: 1.0000 | ORAL_TABLET | ORAL | Status: DC | PRN

## 2013-02-22 MED ORDER — PROPOFOL 10 MG/ML IV BOLUS
INTRAVENOUS | Status: DC | PRN
  Administered 2013-02-22: 170 mg via INTRAVENOUS

## 2013-02-22 MED ORDER — ACETAMINOPHEN 325 MG PO TABS
650.0000 mg | ORAL_TABLET | ORAL | Status: DC | PRN
  Filled 2013-02-22: qty 2

## 2013-02-22 MED ORDER — SODIUM CHLORIDE 0.9 % IJ SOLN
3.0000 mL | INTRAMUSCULAR | Status: DC | PRN
  Filled 2013-02-22: qty 3

## 2013-02-22 MED ORDER — SODIUM CHLORIDE 0.9 % IJ SOLN
3.0000 mL | Freq: Two times a day (BID) | INTRAMUSCULAR | Status: DC
  Filled 2013-02-22: qty 3

## 2013-02-22 MED ORDER — SILVER SULFADIAZINE 1 % EX CREA
TOPICAL_CREAM | CUTANEOUS | Status: DC | PRN
  Administered 2013-02-22: 1 via TOPICAL

## 2013-02-22 MED ORDER — OXYCODONE HCL 5 MG PO TABS
5.0000 mg | ORAL_TABLET | ORAL | Status: DC | PRN
  Filled 2013-02-22: qty 2

## 2013-02-22 MED ORDER — MIDAZOLAM HCL 2 MG/2ML IJ SOLN
INTRAMUSCULAR | Status: AC
  Filled 2013-02-22: qty 2

## 2013-02-22 MED ORDER — MORPHINE SULFATE 2 MG/ML IJ SOLN
1.0000 mg | INTRAMUSCULAR | Status: DC | PRN
  Filled 2013-02-22: qty 2

## 2013-02-22 MED ORDER — KETOROLAC TROMETHAMINE 30 MG/ML IJ SOLN
INTRAMUSCULAR | Status: DC | PRN
  Administered 2013-02-22: 30 mg via INTRAVENOUS

## 2013-02-22 MED ORDER — ONDANSETRON HCL 4 MG/2ML IJ SOLN
4.0000 mg | Freq: Four times a day (QID) | INTRAMUSCULAR | Status: DC | PRN
  Filled 2013-02-22: qty 2

## 2013-02-22 MED ORDER — PROMETHAZINE HCL 25 MG/ML IJ SOLN
6.2500 mg | INTRAMUSCULAR | Status: DC | PRN
  Filled 2013-02-22: qty 1

## 2013-02-22 MED ORDER — FENTANYL CITRATE 0.05 MG/ML IJ SOLN
25.0000 ug | INTRAMUSCULAR | Status: DC | PRN
  Filled 2013-02-22: qty 1

## 2013-02-22 MED ORDER — SODIUM CHLORIDE 0.9 % IV SOLN
250.0000 mL | INTRAVENOUS | Status: DC | PRN
Start: 2013-02-22 — End: 2013-02-22
  Filled 2013-02-22: qty 250

## 2013-02-22 MED ORDER — DEXAMETHASONE SODIUM PHOSPHATE 4 MG/ML IJ SOLN
INTRAMUSCULAR | Status: DC | PRN
  Administered 2013-02-22: 10 mg via INTRAVENOUS

## 2013-02-22 MED ORDER — LACTATED RINGERS IV SOLN
INTRAVENOUS | Status: DC
  Administered 2013-02-22 (×2): via INTRAVENOUS
  Filled 2013-02-22: qty 1000

## 2013-02-22 MED ORDER — FENTANYL CITRATE 0.05 MG/ML IJ SOLN
INTRAMUSCULAR | Status: AC
  Filled 2013-02-22: qty 4

## 2013-02-22 MED ORDER — MIDAZOLAM HCL 5 MG/5ML IJ SOLN
INTRAMUSCULAR | Status: DC | PRN
  Administered 2013-02-22: 2 mg via INTRAVENOUS

## 2013-02-22 MED ORDER — LACTATED RINGERS IV SOLN: INTRAVENOUS | Status: DC | PRN

## 2013-02-22 MED ORDER — KETOROLAC TROMETHAMINE 30 MG/ML IJ SOLN
30.0000 mg | Freq: Four times a day (QID) | INTRAMUSCULAR | Status: DC
  Filled 2013-02-22: qty 1

## 2013-02-22 MED ORDER — ONDANSETRON HCL 4 MG/2ML IJ SOLN
INTRAMUSCULAR | Status: DC | PRN
  Administered 2013-02-22: 4 mg via INTRAVENOUS

## 2013-02-22 MED ORDER — LIDOCAINE HCL (CARDIAC) 20 MG/ML IV SOLN
INTRAVENOUS | Status: DC | PRN
  Administered 2013-02-22: 80 mg via INTRAVENOUS

## 2013-02-22 MED ORDER — ACETIC ACID 5 % SOLN
Status: DC | PRN
  Administered 2013-02-22: 1 via TOPICAL

## 2013-02-22 MED ORDER — BUPIVACAINE-EPINEPHRINE PF 0.25-1:200000 % IJ SOLN
INTRAMUSCULAR | Status: DC | PRN
  Administered 2013-02-22: 9 mL

## 2013-02-22 SURGICAL SUPPLY — 47 items
BENZOIN TINCTURE PRP APPL 2/3 (GAUZE/BANDAGES/DRESSINGS) ×3 IMPLANT
BLADE HEX COATED 2.75 (ELECTRODE) ×3 IMPLANT
BLADE SURG 10 STRL SS (BLADE) IMPLANT
BLADE SURG 15 STRL LF DISP TIS (BLADE) ×1 IMPLANT
BLADE SURG 15 STRL SS (BLADE) ×2
BLADE SURG ROTATE 9660 (MISCELLANEOUS) IMPLANT
CANISTER SUCTION 1200CC (MISCELLANEOUS) ×3 IMPLANT
CLOTH BEACON ORANGE TIMEOUT ST (SAFETY) IMPLANT
COVER TABLE BACK 60X90 (DRAPES) ×3 IMPLANT
DRAPE LG THREE QUARTER DISP (DRAPES) ×3 IMPLANT
DRAPE UNDERBUTTOCKS STRL (DRAPE) ×3 IMPLANT
DRSG PAD ABDOMINAL 8X10 ST (GAUZE/BANDAGES/DRESSINGS) ×3 IMPLANT
ELECT NEEDLE TIP 2.8 STRL (NEEDLE) IMPLANT
ELECT REM PT RETURN 9FT ADLT (ELECTROSURGICAL) ×3
ELECTRODE REM PT RTRN 9FT ADLT (ELECTROSURGICAL) ×1 IMPLANT
GAUZE SPONGE 4X4 12PLY STRL LF (GAUZE/BANDAGES/DRESSINGS) IMPLANT
GAUZE SPONGE 4X4 16PLY XRAY LF (GAUZE/BANDAGES/DRESSINGS) ×3 IMPLANT
GELFOAM 100 342 01 (MISCELLANEOUS) IMPLANT
GELFOAM SMALL 12 7MM 315 3 (MISCELLANEOUS) IMPLANT
GLOVE BIO SURGEON STRL SZ8 (GLOVE) ×3 IMPLANT
GLOVE BIOGEL M STER SZ 6 (GLOVE) ×6 IMPLANT
GLOVE INDICATOR 6.5 STRL GRN (GLOVE) ×6 IMPLANT
GLOVE INDICATOR 7.5 STRL GRN (GLOVE) ×6 IMPLANT
GOWN W/2 COTTON TOWELS 2 STD (GOWNS) IMPLANT
LEGGING LITHOTOMY PAIR STRL (DRAPES) ×3 IMPLANT
NDL SAFETY ECLIPSE 18X1.5 (NEEDLE) ×1 IMPLANT
NEEDLE HYPO 18GX1.5 SHARP (NEEDLE) ×2
NEEDLE HYPO 25X1 1.5 SAFETY (NEEDLE) ×6 IMPLANT
PACK BASIN DAY SURGERY FS (CUSTOM PROCEDURE TRAY) ×3 IMPLANT
PAD ABD 8X10 STRL (GAUZE/BANDAGES/DRESSINGS) IMPLANT
PAD PREP 24X48 CUFFED NSTRL (MISCELLANEOUS) ×3 IMPLANT
PENCIL BUTTON HOLSTER BLD 10FT (ELECTRODE) ×3 IMPLANT
SPONGE GAUZE 4X4 12PLY STER LF (GAUZE/BANDAGES/DRESSINGS) ×3 IMPLANT
SURGILUBE 2OZ TUBE FLIPTOP (MISCELLANEOUS) ×3 IMPLANT
SUT CHROMIC 2 0 SH (SUTURE) IMPLANT
SUT CHROMIC 3 0 SH 27 (SUTURE) IMPLANT
SWAB COLLECTION DEVICE MRSA (MISCELLANEOUS) IMPLANT
SYR BULB IRRIGATION 50ML (SYRINGE) IMPLANT
SYR CONTROL 10ML LL (SYRINGE) ×6 IMPLANT
TAPE HYPAFIX 4 X10 (GAUZE/BANDAGES/DRESSINGS) ×3 IMPLANT
TOWEL OR 17X24 6PK STRL BLUE (TOWEL DISPOSABLE) ×6 IMPLANT
TRAY DSU PREP LF (CUSTOM PROCEDURE TRAY) ×3 IMPLANT
TUBE ANAEROBIC SPECIMEN COL (MISCELLANEOUS) IMPLANT
UNDERPAD 30X30 INCONTINENT (UNDERPADS AND DIAPERS) ×3 IMPLANT
VACUUM HOSE 7/8X10 W/ WAND (MISCELLANEOUS) ×3 IMPLANT
WATER STERILE IRR 500ML POUR (IV SOLUTION) ×3 IMPLANT
YANKAUER SUCT BULB TIP NO VENT (SUCTIONS) ×3 IMPLANT

## 2013-02-22 NOTE — Discharge Instructions (Signed)
[facility] _______[facility]  RECTAL SURGERY POST OP INSTRUCTIONS: POST OP INSTRUCTIONS  Always review your discharge instruction sheet given to you by the facility where your surgery was performed. IF YOU HAVE DISABILITY OR FAMILY LEAVE FORMS, YOU MUST BRING THEM TO THE OFFICE FOR PROCESSING.   DO NOT GIVE THEM TO YOUR DOCTOR.  1. A  prescription for pain medication may be given to you upon discharge.  Take your pain medication as prescribed, if needed.  If narcotic pain medicine is not needed, then you may take acetaminophen (Tylenol) or ibuprofen (Advil) as needed. 2. Take your usually prescribed medications unless otherwise directed. 3. If you need a refill on your pain medication, please contact your pharmacy.  They will contact our office to request authorization. Prescriptions will not be filled after 5 pm or on week-ends. 4. You should follow a light diet the first 48 hours after arrival home, such as soup and crackers, etc.  Be sure to include lots of fluids daily.  Resume your normal diet 2-3 days after surgery.. 5. Most patients will experience some swelling and discomfort in the rectal area. Ice packs, reclining and warm tub soaks will help.  Swelling and discomfort can take several days to resolve.  6. SOAK IN WARM WATER TUB 3-4 TIMES A DAY AND AFTER A BOWEL MOVEMENT 7. It is common to experience some constipation if taking pain medication after surgery. DRINK 6 GLASSES OF WATER A DAY.  Increasing fluid intake and taking a stool softener (such as Colace) will usually help or prevent this problem from occurring.  A mild laxative (Milk of Magnesia or Miralax) should be taken according to package directions if there are no bowel movements after 24 hours. 8. Unless discharge instructions indicate otherwise, leave your bandage dry and in place for 24 hours, or remove the bandage if you have a bowel movement. You will notice a small amount of bleeding with bowel movements for the  first few days. You have some packing in the rectum which will come out over the first day or two. You will need to wear an absorbent pad or soft cotton gauze in your underwear until the drainage stops.it. 9. ACTIVITIES:  You may resume regular (light) daily activities beginning the next day--such as daily self-care, walking, climbing stairs--gradually increasing activities as tolerated.  You may have sexual intercourse when it is comfortable.  Refrain from any heavy lifting or straining until approved by your doctor. a. You may drive when you are no longer taking prescription pain medication, you can comfortably wear a seatbelt, and you can safely maneuver your car and apply brakes. b. RETURN TO WORK: : ____________________ c.  10. You should see your doctor in the office for a follow-up appointment approximately 2-3 weeks after your surgery.  Make sure that you call for this appointment within a day or two after you arrive home to insure a convenient appointment time. 11. OTHER INSTRUCTIONS:  __________________________________________________________________________________________________________________________________________________________________________________________  WHEN TO CALL YOUR DOCTOR: 1. Fever over 101.0 2. Inability to urinate 3. Nausea and/or vomiting 4. Extreme swelling or bruising 5. Continued bleeding from rectum. 6. Increased pain, redness, or drainage from the incision 7. Constipation  The clinic staff is available to answer your questions during regular business hours.  Please dont hesitate to call and ask to speak to one of the nurses for clinical concerns.  If you have a medical emergency, go to the nearest emergency room or call 911.  A surgeon from [facility] is always on call at the hospital   [address], [place], [place], [place]  [address] ?  [address], [place], [place]   [address] [phone] ? [phone] ? FAX [phone]  [phone] Web site: [email or web address]   Post Anesthesia Home Care Instructions  Activity: Get plenty of rest for the remainder of the day. A responsible adult should stay with you for 24 hours following the procedure.  For the next 24 hours, DO NOT: -Drive a car -Paediatric nurse -Drink alcoholic beverages -Take any medication unless instructed by your physician -Make any legal decisions or sign important papers.  Meals: Start with liquid foods such as gelatin or soup. Progress to regular foods as tolerated. Avoid greasy, spicy, heavy foods. If nausea and/or vomiting occur, drink only clear liquids until the nausea and/or vomiting subsides. Call your physician if vomiting continues.  Special Instructions/Symptoms: Your throat may feel dry or sore from the anesthesia or the breathing tube placed in your throat during surgery. If this causes discomfort, gargle with warm salt water. The discomfort should disappear within 24 hours.

## 2013-02-22 NOTE — Interval H&P Note (Signed)
History and Physical Interval Note:  02/22/2013 11:34 AM  Arthur Park  has presented today for surgery, with the diagnosis of anal warts  The various methods of treatment have been discussed with the patient and family. After consideration of risks, benefits and other options for treatment, the patient has consented to  Procedure(s): EXAM UNDER ANESTHESIA, LASER FULGURATION OF ANAL CANAL WARTS (N/A) as a surgical intervention .  The patient's history has been reviewed, patient examined, no change in status, stable for surgery.  I have reviewed the patient's chart and labs.  Questions were answered to the patient's satisfaction.    [name]. [name], MD, [place], Bariatric, & Minimally Invasive Surgery [facility], [place]   [facility] M

## 2013-02-22 NOTE — Transfer of Care (Signed)
Immediate Anesthesia Transfer of Care Note  Patient: Arthur Park  Procedure(s) Performed: Procedure(s) (LRB): EXAM UNDER ANESTHESIA, LASER FULGURATION OF ANAL CANAL WARTS (N/A)  Patient Location: PACU  Anesthesia Type: General  Level of Consciousness: awake, sedated, patient cooperative and responds to stimulation  Airway & Oxygen Therapy: Patient Spontanous Breathing and Patient connected to face mask oxygen  Post-op Assessment: Report given to PACU RN, Post -op Vital signs reviewed and stable and Patient moving all extremities  Post vital signs: Reviewed and stable  Complications: No apparent anesthesia complications

## 2013-02-22 NOTE — Anesthesia Preprocedure Evaluation (Addendum)
Anesthesia Evaluation  Patient identified by MRN, date of birth, ID band Patient awake    Reviewed: Allergy & Precautions, H&P , NPO status , Patient's Chart, lab work & pertinent test results  Airway Mallampati: II TM Distance: >3 FB Neck ROM: Full    Dental no notable dental hx.    Pulmonary neg pulmonary ROS,  breath sounds clear to auscultation  Pulmonary exam normal       Cardiovascular Exercise Tolerance: Good negative cardio ROS  Rhythm:Regular Rate:Normal     Neuro/Psych PSYCHIATRIC DISORDERS Anxiety Depression negative neurological ROS     GI/Hepatic negative GI ROS, Neg liver ROS,   Endo/Other  negative endocrine ROS  Renal/GU negative Renal ROS  negative genitourinary   Musculoskeletal negative musculoskeletal ROS (+)   Abdominal   Peds negative pediatric ROS (+)  Hematology negative hematology ROS (+)   Anesthesia Other Findings   Reproductive/Obstetrics negative OB ROS                           Anesthesia Physical Anesthesia Plan  ASA: II  Anesthesia Plan: General   Post-op Pain Management:    Induction: Intravenous  Airway Management Planned: Oral ETT  Additional Equipment:   Intra-op Plan:   Post-operative Plan: Extubation in OR  Informed Consent: I have reviewed the patients History and Physical, chart, labs and discussed the procedure including the risks, benefits and alternatives for the proposed anesthesia with the patient or authorized representative who has indicated his/her understanding and acceptance.   Dental advisory given  Plan Discussed with: CRNA  Anesthesia Plan Comments: (Prone position requested. ETT)       Anesthesia Quick Evaluation

## 2013-02-22 NOTE — Anesthesia Postprocedure Evaluation (Signed)
  Anesthesia Post-op Note  Patient: Arthur Park  Procedure(s) Performed: Procedure(s) (LRB): EXAM UNDER ANESTHESIA, LASER FULGURATION OF ANAL CANAL WARTS (N/A)  Patient Location: PACU  Anesthesia Type: General  Level of Consciousness: awake and alert   Airway and Oxygen Therapy: Patient Spontanous Breathing  Post-op Pain: mild  Post-op Assessment: Post-op Vital signs reviewed, Patient's Cardiovascular Status Stable, Respiratory Function Stable, Patent Airway and No signs of Nausea or vomiting  Last Vitals:  Filed Vitals:   02/22/13 1432  BP: 132/85  Pulse: 97  Temp: 36.2 C  Resp: 18    Post-op Vital Signs: stable   Complications: No apparent anesthesia complications

## 2013-02-22 NOTE — Op Note (Signed)
02/22/2013  12:34 PM  PATIENT:  Arthur Park  33 y.o. male  PRE-OPERATIVE DIAGNOSIS:  anal warts  POST-OPERATIVE DIAGNOSIS:  anal warts  PROCEDURE:  Procedure(s): EXAM UNDER ANESTHESIA, LASER FULGURATION OF ANAL CANAL AND PERIANAL WARTS  SURGEON:  Surgeon(s): Gayland Curry, MD [name], MD  ASSISTANTS: Dr [name]   ANESTHESIA:   general  DRAINS: none   LOCAL MEDICATIONS USED:  OTHER 20cc Exparel  SPECIMEN:  No Specimen  DISPOSITION OF SPECIMEN:  N/A  COUNTS:  YES  INDICATION FOR PROCEDURE: 33 year old Caucasian male who developed perianal warts back in the fall. He was started on Aldara ointment and had a good response to his perianal warts. However he still had persistent anal canal warts. His skin was still slightly hyperemic from the 18 weeks of Aldara ointment when seen in the office several weeks ago. He desired definitive surgical management for his anal canal warts. We discussed the risk and benefits of surgery including but not limited to bleeding, infection, recurrence, anal canal scarring and narrowing, blood clot formation, abscess, urinary retention, postoperative pain, and general anesthesia risk as well as the typical recovery course  PROCEDURE: After obtaining informed consent, the patient was taken to operating room one at [facility] long outpatient surgery [facility]. General endotracheal anesthesia was established. Sequential compression devices were placed. He was then flipped into the prone jackknife position with the appropriate padding. His buttocks were taped apart. His perineal and buttocks were prepped and draped in Betadine in the usual standard surgical fashion. A surgical timeout was performed. Digital rectal exam was performed followed by circumferential anoscopy. He had a few perianal warts. He had circumferential spotty anal canal warts extending up to and slightly above the dentate line. There is a small grouping of warts along the posterior  midline and anal canal.  Acetic acid soaked gauze pads Were placed around the perianal and anal canal to further identify the warts. After several minutes, we started lasering the warts by quadrants. A laser setting of 12 continuous was used. All of the visible and palpable warts were destroyed with the laser. There is no palpable wart left. The surrounding perineal skin was somewhat raw and cracked probably from his long-standing use of Aldara. A regional block was performed with 20 cc of Exparel. A piece of Gelfoam was placed within the rectal vault. We then applied 4 x 4's ABDs and mesh underwear. The patient tolerated the procedure well. There were no immediate complications. The patient was placed into the supine position extubated and taken to recovery room  PLAN OF CARE: Discharge to home after PACU  PATIENT DISPOSITION:  PACU - hemodynamically stable.   Delay start of Pharmacological VTE agent (>24hrs) due to surgical blood loss or risk of bleeding:  not applicable  [name]. [name], MD, FACS General, Bariatric, & Minimally Invasive Surgery [facility], [place]

## 2013-02-22 NOTE — H&P (View-Only) (Signed)
Patient ID: Arthur Park, male   DOB: [birth year], 33 y.o.   MRN: [id number]  Chief Complaint  Patient presents with  . New Evaluation    eval anal warts    HPI Arthur Park is a 33 y.o. male.   HPI 33 year old Caucasian male referred by Dr. Merri Ray for evaluation of anal warts.The patient has been dealing with perianal warts for several months now. He initially was started on Aldara ointment that he was applying 3 nights a week. He did 16 weeks of Aldara treatment. He had excellent response to the perianal skin lesions; however, there is concern that he may have internal anal warts. He states he is HIV negative. He identifies himself as homosexual. He is not sexually active currently. He denies any fever, chills, nausea, vomiting, diarrhea or constipation. He denies any melena or hematochezia. He denies any incontinence. He denies any pain with defecation. Past Medical History  Diagnosis Date  . Anemia   . Depression     History reviewed. No pertinent past surgical history.  Family History  Problem Relation Age of Onset  . Depression Mother   . Drug abuse Brother   . Cancer Father     prostate & thyroid    Social History History  Substance Use Topics  . Smoking status: Never Smoker   . Smokeless tobacco: Never Used  . Alcohol Use: Yes     Comment: 3 x week    No Known Allergies  Current Outpatient Prescriptions  Medication Sig Dispense Refill  . Ascorbic Acid (VITAMIN Arthur) 100 MG tablet Take 100 mg by mouth daily.      Marland Kitchen buPROPion (WELLBUTRIN XL) 300 MG 24 hr tablet Take 300 mg by mouth daily.      Marland Kitchen gabapentin (NEURONTIN) 300 MG capsule Take 300 mg by mouth 3 (three) times daily.      . Multiple Vitamins-Minerals (MULTIVITAMIN WITH MINERALS) tablet Take 1 tablet by mouth daily.      . vitamin A 10000 UNIT capsule Take 10,000 Units by mouth daily.      Marland Kitchen zinc gluconate 50 MG tablet Take 50 mg by mouth daily.       No current facility-administered medications for this  visit.    Review of Systems Review of Systems  Constitutional: Negative for fever, chills, appetite change and unexpected weight change.  HENT: Negative for congestion and trouble swallowing.   Eyes: Negative for visual disturbance.  Respiratory: Negative for chest tightness and shortness of breath.   Cardiovascular: Negative for chest pain and leg swelling.       No PND, no orthopnea, no DOE  Gastrointestinal:       See HPI  Genitourinary: Negative for dysuria and hematuria.  Musculoskeletal: Negative.   Skin: Negative for rash.  Neurological: Negative for seizures and speech difficulty.  Hematological: Does not bruise/bleed easily.  Psychiatric/Behavioral: Negative for behavioral problems and confusion.    Blood pressure 124/82, pulse 68, temperature 97.8 F (36.6 Arthur), temperature source Temporal, resp. rate 14, height 5\' 10"  (1.778 m), weight 150 lb 12.8 oz (68.402 kg).  Physical Exam Physical Exam  Constitutional: He is oriented to person, place, and time. He appears well-developed and well-nourished. No distress.  HENT:  Head: Normocephalic and atraumatic.  Right Ear: External ear normal.  Left Ear: External ear normal.  Eyes: Conjunctivae are normal. No scleral icterus.  Neck: Normal range of motion. Neck supple. No tracheal deviation present. No thyromegaly present.  Cardiovascular: Normal rate, normal heart sounds  and intact distal pulses.   Pulmonary/Chest: Effort normal and breath sounds normal. No respiratory distress. He has no wheezes.  Abdominal: Soft. He exhibits no distension. There is no tenderness. There is no rebound.  Genitourinary: Rectal exam shows no external hemorrhoid and anal tone normal.  Visual inspection of his perineum reveals some hyperemic perianal skin; however it does not blanch. Digital rectal exam reveals good tone. Anoscopy was performed which demonstrated spotty circumferential small warts just inside the anal verge to the dentate line.  Perhaps a grade 1 internal hemorrhoid in the left lateral position  Musculoskeletal: Normal range of motion. He exhibits no edema and no tenderness.  Lymphadenopathy:    He has no cervical adenopathy.  Neurological: He is alert and oriented to person, place, and time. He exhibits normal muscle tone.  Skin: Skin is warm and dry. No rash noted. He is not diaphoretic. No erythema. No pallor.  Psychiatric: He has a normal mood and affect. His behavior is normal. Judgment and thought content normal.    Data Reviewed Dr Vonna Kotyk office note  Assessment    Anal canal condyloma     Plan    I discussed the etiology of anal warts. I explained that the warts are caused by a sexually transmitted virus called HPV. I explained that there are different subtypes of HPV some of which are low risk and some of which are high-risk for the development of cancers. I explained that the majority of anal warts are associated with the low risk HPV.  We discussed how condyloma is related to sexual activity and how it can be spread.   I discussed the importance of testing for HIV. The patient has had a recent HIV test and is negative.   We discussed the management of anal warts. We discussed chemical destruction, immunotherapy, and surgical excision including laser therapy. I discussed the pros and cons of each approach. We discussed the risk and benefits and the expected outcome with chemical destruction with agents such as podophyllin. I explained that podophyllin is generally not been effective and has a high recurrence rate.  We then discussed surgical excision specifically excision and fulguration and laser therapy. I explained how the surgery is performed. I explained that it can be painful however it generally has the highest success rate. We discussed the risk and benefits of surgery including but not limited to bleeding, infection, injury to surrounding structures, need to do a formal anoscopic exam to evaluate  for anal canal warts, urinary retention, wart recurrence, and general anesthesia risk. We discussed the typical aftercare.  He has had excellent response to his external warts with Aldara. However he has persistent circumferential anal canal warts. At this point I've recommended surgery. He is elected to proceed with exam under anesthesia, and laser fulguration. I explained that Dr. Marcello Moores would be assisting me because we would be using the laser.  He was instructed do a fleets enema the morning of surgery. I want to wait at least an additional 2 weeks before getting him in the operating room in order to allow the irritation around his perineum to completely subside.  [name]. [name], MD, FACS General, Bariatric, & Minimally Invasive Surgery [facility] Surgery, [place]        [facility] M 02/03/2013, 11:19 AM

## 2013-02-22 NOTE — Anesthesia Procedure Notes (Signed)
Procedure Name: Intubation Date/Time: 02/22/2013 11:51 AM Performed by: Bethena Roys T Pre-anesthesia Checklist: Patient identified, Emergency Drugs available, Suction available and Patient being monitored Patient Re-evaluated:Patient Re-evaluated prior to inductionOxygen Delivery Method: Circle System Utilized Preoxygenation: Pre-oxygenation with 100% oxygen Intubation Type: IV induction Ventilation: Mask ventilation without difficulty Laryngoscope Size: Mac and 3 Grade View: Grade I Tube type: Oral Tube size: 7.0 mm Number of attempts: 1 Airway Equipment and Method: stylet and oral airway Placement Confirmation: ETT inserted through vocal cords under direct vision,  positive ETCO2 and breath sounds checked- equal and bilateral Secured at: 21 cm Tube secured with: Tape Dental Injury: Teeth and Oropharynx as per pre-operative assessment

## 2013-02-23 ENCOUNTER — Encounter (HOSPITAL_BASED_OUTPATIENT_CLINIC_OR_DEPARTMENT_OTHER): Payer: Self-pay | Admitting: General Surgery

## 2013-02-24 ENCOUNTER — Telehealth (INDEPENDENT_AMBULATORY_CARE_PROVIDER_SITE_OTHER): Payer: Self-pay

## 2013-02-24 ENCOUNTER — Telehealth (INDEPENDENT_AMBULATORY_CARE_PROVIDER_SITE_OTHER): Payer: Self-pay | Admitting: General Surgery

## 2013-02-24 NOTE — Telephone Encounter (Signed)
Called patient to let him know per Dr [name] that he can do the the miralax daily. And he can try milk of magnesia as well . And intermitted bloody drainage is expected.

## 2013-02-24 NOTE — Telephone Encounter (Signed)
Called patient and told him to do the miralax daily and milk of magnesia as well. And intermittent bloody drainage is expected

## 2013-02-24 NOTE — Telephone Encounter (Signed)
Patient is 2 days s/p EUA, laser fulguration of anal canal warts. He states he is having occasional spots of blood and has not had a bowel movement yet. He tried one dose of miralax last night with no results. Advised miralax could take a day or two to work and to continue taking it as directed. I told him if he does not have a BM by tomorrow he should take the miralax twice a day. Encouraged him to drink a couple 8oz. Glasses of water a day and to walk around as much as possible to help get his bowels moving. He will call us back if no improvements before end of week and we will call back if Dr [name] suggests anything else for him to try.

## 2013-02-24 NOTE — Telephone Encounter (Signed)
pls call pt - do miralax daily. Can try milk of magnesia as well. Intermittent bloody drainage is expected.

## 2013-03-04 ENCOUNTER — Telehealth (INDEPENDENT_AMBULATORY_CARE_PROVIDER_SITE_OTHER): Payer: Self-pay

## 2013-03-04 NOTE — Telephone Encounter (Signed)
Patient states he is still having minimal anal bleeding, he noted when he had a bm this am bleeding was more than usual , advised patient to continue the sitz bath bid and I would inform Dr. [name] and call with further instructions. Patient verbalized understanding

## 2013-03-05 ENCOUNTER — Encounter (INDEPENDENT_AMBULATORY_CARE_PROVIDER_SITE_OTHER): Payer: Self-pay | Admitting: General Surgery

## 2013-03-05 ENCOUNTER — Telehealth (INDEPENDENT_AMBULATORY_CARE_PROVIDER_SITE_OTHER): Payer: Self-pay

## 2013-03-05 NOTE — Telephone Encounter (Signed)
Called the pt back today after receiving his e-mail.  He says he is still bleeding after BM's and does not feel that this should still be happening one and one half weeks after surgery.  He denies any dizziness.  I offered him an appointment to see our urgent office physician which he declined.  I recommended he call us first thing Monday morning if he was still concerned about the bleeding and we would see him in urgent office.  Pt agreed with this plan.

## 2013-03-24 ENCOUNTER — Telehealth (INDEPENDENT_AMBULATORY_CARE_PROVIDER_SITE_OTHER): Payer: Self-pay | Admitting: General Surgery

## 2013-03-24 ENCOUNTER — Telehealth (INDEPENDENT_AMBULATORY_CARE_PROVIDER_SITE_OTHER): Payer: Self-pay | Admitting: *Deleted

## 2013-03-24 ENCOUNTER — Encounter (INDEPENDENT_AMBULATORY_CARE_PROVIDER_SITE_OTHER): Payer: Self-pay | Admitting: General Surgery

## 2013-03-24 ENCOUNTER — Ambulatory Visit (INDEPENDENT_AMBULATORY_CARE_PROVIDER_SITE_OTHER): Payer: BC Managed Care – PPO | Admitting: General Surgery

## 2013-03-24 VITALS — BP 122/80 | HR 68 | Temp 97.5°F | Resp 16 | Ht 70.0 in | Wt 152.0 lb

## 2013-03-24 DIAGNOSIS — Z09 Encounter for follow-up examination after completed treatment for conditions other than malignant neoplasm: Secondary | ICD-10-CM

## 2013-03-24 MED ORDER — AMBULATORY NON FORMULARY MEDICATION: 1.0000 [drp] | Freq: Two times a day (BID) | Status: AC

## 2013-03-24 MED ORDER — NITROGLYCERIN 0.4 % RE OINT: 1.0000 "application " | TOPICAL_OINTMENT | Freq: Two times a day (BID) | RECTAL | Status: DC

## 2013-03-24 NOTE — Progress Notes (Signed)
Subjective:     Patient ID: Arthur Park, male   DOB: 1980-10-21, 33 y.o.   MRN: [id number]  HPI 33 year old Caucasian male comes in today for his first postoperative appointment after undergoing exam under anesthesia and laser fulguration of perianal and anal canal warts on February 9. He states that he is still having severe pain when having a bowel movement. The bleeding stopped about 1-1/2 weeks ago. He reports a good appetite. He denies any fever, chills, dysuria. He is taking ibuprofen as needed for the pain. He is having a bowel movement every day to every other day. He is doing sitz baths and using wet wipes  Review of Systems     Objective:   Physical Exam BP 122/80  Pulse 68  Temp(Src) 97.5 F (36.4 C) (Oral)  Resp 16  Ht 5\' 10"  (1.778 m)  Wt 152 lb (68.947 kg)  BMI 21.81 kg/m2 Alert, no apparent distress, nontoxic Rectal-visual inspection only today-no cellulitis, induration or fluctuance. Healing skin in the posterior lateral section. Some hyperpigmentation of skin around the anus circumferentially for about 1-1/2-2 cm. Rectal exam deferred today    Assessment:     Anal canal and perianal condyloma status post exam under anesthesia, laser fulguration of perianal and anal canal warts     Plan:     There appear to be no signs of infection. Unfortunately he is having pain with defecation. He is already doing all the things necessary such as wet wipes, sitz baths. We discussed the importance of avoiding constipation which would exacerbate the discomfort. We discussed the importance of drinking 6-8 glasses of water a day. Also gave him a prescription for nifedipine ointment which hopefully will relax his sphincter which will help with the discomfort. We'll see him back in 5 weeks at which time we'll do anoscopy.  [name]. [name], MD, FACS General, Bariatric, & Minimally Invasive Surgery [facility] Surgery, [place]

## 2013-03-24 NOTE — Telephone Encounter (Signed)
Pt called re med Nifedipine.  His pharmacy does not file insurance and pt states he cannot afford to pay for it and be reimbursed.  He is asking for something different?  Please advise.  Anderson Malta

## 2013-03-24 NOTE — Patient Instructions (Signed)
Apply nifedipine ointment twice a day - it will help relax your sphincter muscle Continue to do sitz bathes before and after a bowel movement Stay as regular as possible - you may have to take a stool softner or Laxative like Miralax Constipation will make the problem worse Drink 6-8 glasses of water a day Use wet wipes

## 2013-03-24 NOTE — Telephone Encounter (Signed)
Called patient and told him that Dr [name] sent a Rx for Rectiv (nitroglycerin) ointment 0.4% was called in [facility] and I told the patient to go to [email or web address] and print out Rx saving card

## 2013-03-24 NOTE — Addendum Note (Signed)
Addended by: [name], Charish Schroepfer M on: 03/24/2013 01:40 PM   Modules accepted: Orders

## 2013-04-21 ENCOUNTER — Encounter (INDEPENDENT_AMBULATORY_CARE_PROVIDER_SITE_OTHER): Payer: Self-pay | Admitting: General Surgery

## 2013-04-21 ENCOUNTER — Ambulatory Visit (INDEPENDENT_AMBULATORY_CARE_PROVIDER_SITE_OTHER): Payer: BC Managed Care – PPO | Admitting: General Surgery

## 2013-04-21 VITALS — BP 110/80 | HR 78 | Temp 97.4°F | Resp 14 | Wt 153.4 lb

## 2013-04-21 DIAGNOSIS — Z09 Encounter for follow-up examination after completed treatment for conditions other than malignant neoplasm: Secondary | ICD-10-CM

## 2013-04-21 NOTE — Patient Instructions (Signed)
Try diltiazem ointment  GETTING TO GOOD BOWEL HEALTH. Irregular bowel habits such as constipation and diarrhea can lead to many problems over time.  Having one soft bowel movement a day is the most important way to prevent further problems.  The anorectal canal is designed to handle stretching and feces to safely manage our ability to get rid of solid waste (feces, poop, stool) out of our body.  BUT, hard constipated stools can act like ripping concrete bricks and diarrhea can be a burning fire to this very sensitive area of our body, causing inflamed hemorrhoids, anal fissures, increasing risk is perirectal abscesses, abdominal pain/bloating, an making irritable bowel worse.     The goal: ONE SOFT BOWEL MOVEMENT A DAY!  To have soft, regular bowel movements:    Drink at least 8 tall glasses of water a day.     Take plenty of fiber.  Fiber is the undigested part of plant food that passes into the colon, acting s "natures broom" to encourage bowel motility and movement.  Fiber can absorb and hold large amounts of water. This results in a larger, bulkier stool, which is soft and easier to pass. Work gradually over several weeks up to 6 servings a day of fiber (25g a day even more if needed) in the form of: o Vegetables -- Root (potatoes, carrots, turnips), leafy green (lettuce, salad greens, celery, spinach), or cooked high residue (cabbage, broccoli, etc) o Fruit -- Fresh (unpeeled skin & pulp), Dried (prunes, apricots, cherries, etc ),  or stewed ( applesauce)  o Whole grain breads, pasta, etc (whole wheat)  o Bran cereals    Bulking Agents -- This type of water-retaining fiber generally is easily obtained each day by one of the following:  o Psyllium bran -- The psyllium plant is remarkable because its ground seeds can retain so much water. This product is available as Metamucil, Konsyl, Effersyllium, Per Diem Fiber, or the less expensive generic preparation in drug and health food stores. Although  labeled a laxative, it really is not a laxative.  o Methylcellulose -- This is another fiber derived from wood which also retains water. It is available as Citrucel. o Benefiber o Polyethylene Glycol - and "artificial" fiber commonly called Miralax or Glycolax.  It is helpful for people with gassy or bloated feelings with regular fiber o Flax Seed - a less gassy fiber than psyllium   No reading or other relaxing activity while on the toilet. If bowel movements take longer than 5 minutes, you are too constipated   AVOID CONSTIPATION.  High fiber and water intake usually takes care of this.  Sometimes a laxative is needed to stimulate more frequent bowel movements, but    Laxatives are not a good long-term solution as it can wear the colon out. o Osmotics (Milk of Magnesia, Fleets phosphosoda, Magnesium citrate, MiraLax, GoLytely) are safer than  o Stimulants (Senokot, Castor Oil, Dulcolax, Ex Lax)    o Do not take laxatives for more than 7days in a row.    IF SEVERELY CONSTIPATED, try a Bowel Retraining Program: o Do not use laxatives.  o Eat a diet high in roughage, such as bran cereals and leafy vegetables.  o Drink six (6) ounces of prune or apricot juice each morning.  o Eat two (2) large servings of stewed fruit each day.  o Take one (1) heaping tablespoon of a psyllium-based bulking agent twice a day. Use sugar-free sweetener when possible to avoid excessive calories.  o  Eat a normal breakfast.  o Set aside 15 minutes after breakfast to sit on the toilet, but do not strain to have a bowel movement.  o If you do not have a bowel movement by the third day, use an enema and repeat the above steps.  o

## 2013-04-21 NOTE — Progress Notes (Signed)
Subjective:     Patient ID: Arthur Park, male   DOB: 1980/03/10, 33 y.o.   MRN: [id number]  HPI 33 year old male comes in for followup after undergoing exam under anesthesia, excision and fulguration of perianal and anal canal warts in February. I last saw him in March. He states that he still having a fair amount of discomfort when having a bowel movement. He no longer has any bleeding. His bowel movements are averaging every other day to every 3 days. He is taking a fair amount of water. He did try the nitroglycerin ointment however causes a headache. He denies any pain with urination. He denies any itching or burning.  Review of Systems     Objective:   Physical Exam BP 110/80  Pulse 78  Temp(Src) 97.4 F (36.3 C) (Temporal)  Resp 14  Wt 153 lb 6.4 oz (69.582 kg) Alert, no apparent distress Visual inspection of his rectum reveals an almost completely healed perianal skin-he just has a little bit of hyperpigmentation in the posterior midline. Digital rectal exam revealed good tone. No palpable masses. No evidence of wart recurrence on the perianal skin    Assessment:     Status post exam under anesthesia, excision and laser fulguration of perianal and anal canal warts     Plan:     Overall I think he is slowly healing. I explained that the headache with the nitroglycerin ointment is not uncommon. He was given a prescription for 2% diltiazem ointment to see if that would help. We discussed importance of regular bowel movements to help with pain with defecation. He was instructed to continue drinking plenty water as well as to slowly increase the fiber in his diet. He does like fiber 1 bars. I encouraged him to consider fiber supplementation such as Benefiber or Metamucil. He was given an Customer service manager. Followup in 6 weeks  [name]. [name], MD, FACS General, Bariatric, & Minimally Invasive Surgery [facility] Surgery, [place]

## 2013-06-02 ENCOUNTER — Ambulatory Visit (INDEPENDENT_AMBULATORY_CARE_PROVIDER_SITE_OTHER): Payer: BC Managed Care – PPO | Admitting: General Surgery

## 2013-06-02 ENCOUNTER — Encounter (INDEPENDENT_AMBULATORY_CARE_PROVIDER_SITE_OTHER): Payer: Self-pay | Admitting: General Surgery

## 2013-06-02 VITALS — BP 128/86 | HR 76 | Temp 98.7°F | Resp 18 | Ht 70.0 in | Wt 153.8 lb

## 2013-06-02 DIAGNOSIS — Z09 Encounter for follow-up examination after completed treatment for conditions other than malignant neoplasm: Secondary | ICD-10-CM

## 2013-06-02 NOTE — Patient Instructions (Signed)
Continue what you are doing - water, fiber, wet wipes

## 2013-06-02 NOTE — Progress Notes (Signed)
Subjective:     Patient ID: Arthur Park, male   DOB: 11-01-1980, 33 y.o.   MRN: [id number]  HPI 33 year old male comes in for followup after undergoing exam under anesthesia, laser fulguration of perianal and anal canal warts. I last saw him office on April 8. At that time he was having a fair amount of pain with defecation. He states overall he is doing much better. Occasionally he will still have pain with defecation. It will generally be a 3/10. He is having daily bowel movements. He is taking supplemental fiber. He denies any incontinence. He denies any difficulty urinating. He denies any melena or hematochezia. History taking about 8 glasses of water a day. Using wet wipes to clean himself  Review of Systems     Objective:   Physical Exam BP 128/86  Pulse 76  Temp(Src) 98.7 F (37.1 C) (Temporal)  Resp 18  Ht 5\' 10"  (1.778 m)  Wt 153 lb 12.8 oz (69.763 kg)  BMI 22.07 kg/m2 Alert, nad Rectal - some circumferential hyperpigmentation for about 1 inch around anus. No external warts. Anoscopy deferred    Assessment:     Status post exam under anesthesia, laser fulguration of perianal and anal canal warts     Plan:     Overall I think he is slowly getting back to baseline. The amount of pain with defecation has significantly improved. I deferred anoscopy today. I will bring him back in 6 weeks and we'll do that at that time. I encouraged him to continue drinking plenty of water, soft little fiber, and wet wipes.  [name]. [name], MD, FACS General, Bariatric, & Minimally Invasive Surgery [facility] Surgery, [place]

## 2013-07-14 ENCOUNTER — Ambulatory Visit (INDEPENDENT_AMBULATORY_CARE_PROVIDER_SITE_OTHER): Payer: BC Managed Care – PPO | Admitting: General Surgery

## 2013-07-14 ENCOUNTER — Encounter (INDEPENDENT_AMBULATORY_CARE_PROVIDER_SITE_OTHER): Payer: Self-pay | Admitting: General Surgery

## 2013-07-14 VITALS — BP 122/80 | HR 100 | Temp 98.7°F | Resp 16 | Ht 70.0 in | Wt 155.4 lb

## 2013-07-14 DIAGNOSIS — Z09 Encounter for follow-up examination after completed treatment for conditions other than malignant neoplasm: Secondary | ICD-10-CM

## 2013-07-14 NOTE — Progress Notes (Signed)
Subjective:     Patient ID: Arthur Park, male   DOB: 1980/11/02, 33 y.o.   MRN: [id number]  HPI 33 year old male comes in for long-term followup after going exam under anesthesia, laser fulguration of perianal and anal canal condyloma in February of this year. He states he is doing well. He denies any fever or chills. He denies any nausea vomiting. He reports daily bowel movements. He did have 3 episodes of mild blood on the toilet paper over the last month. He reports daily bowel movements. He denies any itching or burning. He denies pain with defecation.  PMHx, PSHx, SOCHx, FAMHx, ALL reviewed and unchanged  Review of Systems A 8 point Review of systems was performed and all systems are negative except for what is mentioned in the history of present illness     Objective:   Physical Exam  Vitals reviewed. Constitutional: He is oriented to person, place, and time. He appears well-developed and well-nourished. No distress.  HENT:  Head: Normocephalic and atraumatic.  Eyes: Conjunctivae are normal. No scleral icterus.  Pulmonary/Chest: No respiratory distress.  Abdominal: Soft. He exhibits no distension.  Genitourinary: Rectal exam shows no external hemorrhoid, no fissure and anal tone normal.  Some circumferential hyperpigmentation around his anus however no external signs of condyloma. No palpable masses on digital rectal exam. Anoscopy demonstrates well-healed circumferential mucosa. No evidence of condyloma  Musculoskeletal: He exhibits no edema.  Neurological: He is alert and oriented to person, place, and time.  Skin: Skin is warm and dry. He is not diaphoretic.  Psychiatric: He has a normal mood and affect. His behavior is normal. Judgment and thought content normal.       Assessment:     Status post exam under anesthesia, laser fulguration of perianal and anal canal warts     Plan:     Overall I believe he is healed. There is no sign of condyloma. There is no sign  of recurrence. There is no sign of fissure or abscess. We discussed importance of good bowel habits and bathroom hygiene. Followup when necessary  [name]. [name], MD, FACS General, Bariatric, & Minimally Invasive Surgery [facility], [place]

## 2017-09-22 ENCOUNTER — Other Ambulatory Visit: Payer: Self-pay | Admitting: Family Medicine

## 2017-09-22 DIAGNOSIS — R1310 Dysphagia, unspecified: Secondary | ICD-10-CM

## 2017-09-29 ENCOUNTER — Ambulatory Visit
Admission: RE | Admit: 2017-09-29 | Discharge: 2017-09-29 | Disposition: A | Discharge status: Home / Self Care | Payer: BC Managed Care – PPO | Source: Ambulatory Visit | Attending: Family Medicine | Admitting: Family Medicine

## 2017-09-29 DIAGNOSIS — R1310 Dysphagia, unspecified: Secondary | ICD-10-CM

## 2019-02-27 ENCOUNTER — Emergency Department (HOSPITAL_COMMUNITY): Payer: BC Managed Care – PPO

## 2019-02-27 ENCOUNTER — Encounter (HOSPITAL_COMMUNITY): Payer: Self-pay | Admitting: Emergency Medicine

## 2019-02-27 ENCOUNTER — Emergency Department (HOSPITAL_COMMUNITY)
Admission: EM | Admit: 2019-02-27 | Discharge: 2019-02-28 | Disposition: A | Discharge status: Home / Self Care | Payer: BC Managed Care – PPO | Attending: Emergency Medicine | Admitting: Emergency Medicine

## 2019-02-27 ENCOUNTER — Other Ambulatory Visit: Payer: Self-pay

## 2019-02-27 DIAGNOSIS — C6990 Malignant neoplasm of unspecified site of unspecified eye: Secondary | ICD-10-CM | POA: Insufficient documentation

## 2019-02-27 DIAGNOSIS — J90 Pleural effusion, not elsewhere classified: Secondary | ICD-10-CM | POA: Insufficient documentation

## 2019-02-27 DIAGNOSIS — R066 Hiccough: Secondary | ICD-10-CM | POA: Insufficient documentation

## 2019-02-27 DIAGNOSIS — K7689 Other specified diseases of liver: Secondary | ICD-10-CM

## 2019-02-27 DIAGNOSIS — R932 Abnormal findings on diagnostic imaging of liver and biliary tract: Secondary | ICD-10-CM | POA: Insufficient documentation

## 2019-02-27 DIAGNOSIS — R05 Cough: Secondary | ICD-10-CM | POA: Diagnosis not present

## 2019-02-27 DIAGNOSIS — K91870 Postprocedural hematoma of a digestive system organ or structure following a digestive system procedure: Secondary | ICD-10-CM | POA: Insufficient documentation

## 2019-02-27 HISTORY — DX: Malignant (primary) neoplasm, unspecified: C80.1

## 2019-02-27 LAB — CBC WITH DIFFERENTIAL/PLATELET
Abs Immature Granulocytes: 0.11 10*3/uL — ABNORMAL HIGH (ref 0.00–0.07)
Basophils Absolute: 0 10*3/uL (ref 0.0–0.1)
Basophils Relative: 0 %
Eosinophils Absolute: 0 10*3/uL (ref 0.0–0.5)
Eosinophils Relative: 0 %
HCT: 41.5 % (ref 39.0–52.0)
Hemoglobin: 14.5 g/dL (ref 13.0–17.0)
Immature Granulocytes: 1 %
Lymphocytes Relative: 4 %
Lymphs Abs: 0.5 10*3/uL — ABNORMAL LOW (ref 0.7–4.0)
MCH: 27.6 pg (ref 26.0–34.0)
MCHC: 34.9 g/dL (ref 30.0–36.0)
MCV: 78.9 fL — ABNORMAL LOW (ref 80.0–100.0)
Monocytes Absolute: 1 10*3/uL (ref 0.1–1.0)
Monocytes Relative: 8 %
Neutro Abs: 11 10*3/uL — ABNORMAL HIGH (ref 1.7–7.7)
Neutrophils Relative %: 87 %
Platelets: 178 10*3/uL (ref 150–400)
RBC: 5.26 MIL/uL (ref 4.22–5.81)
RDW: 19.4 % — ABNORMAL HIGH (ref 11.5–15.5)
WBC: 12.7 10*3/uL — ABNORMAL HIGH (ref 4.0–10.5)
nRBC: 0 % (ref 0.0–0.2)

## 2019-02-27 LAB — PROTIME-INR
INR: 1.9 — ABNORMAL HIGH (ref 0.8–1.2)
Prothrombin Time: 22 seconds — ABNORMAL HIGH (ref 11.4–15.2)

## 2019-02-27 LAB — I-STAT CHEM 8, ED
BUN: 13 mg/dL (ref 6–20)
Calcium, Ion: 1.1 mmol/L — ABNORMAL LOW (ref 1.15–1.40)
Chloride: 93 mmol/L — ABNORMAL LOW (ref 98–111)
Creatinine, Ser: 0.6 mg/dL — ABNORMAL LOW (ref 0.61–1.24)
Glucose, Bld: 137 mg/dL — ABNORMAL HIGH (ref 70–99)
HCT: 49 % (ref 39.0–52.0)
Hemoglobin: 16.7 g/dL (ref 13.0–17.0)
Potassium: 4.9 mmol/L (ref 3.5–5.1)
Sodium: 129 mmol/L — ABNORMAL LOW (ref 135–145)
TCO2: 25 mmol/L (ref 22–32)

## 2019-02-27 LAB — COMPREHENSIVE METABOLIC PANEL
ALT: 121 U/L — ABNORMAL HIGH (ref 0–44)
AST: 340 U/L — ABNORMAL HIGH (ref 15–41)
Albumin: 2.3 g/dL — ABNORMAL LOW (ref 3.5–5.0)
Alkaline Phosphatase: 330 U/L — ABNORMAL HIGH (ref 38–126)
Anion gap: 12 (ref 5–15)
BUN: 14 mg/dL (ref 6–20)
CO2: 22 mmol/L (ref 22–32)
Calcium: 8.4 mg/dL — ABNORMAL LOW (ref 8.9–10.3)
Chloride: 93 mmol/L — ABNORMAL LOW (ref 98–111)
Creatinine, Ser: 0.5 mg/dL — ABNORMAL LOW (ref 0.61–1.24)
GFR calc Af Amer: >60 mL/min (ref >60)
GFR calc non Af Amer: >60 mL/min (ref >60)
Glucose, Bld: 148 mg/dL — ABNORMAL HIGH (ref 70–99)
Potassium: 4.8 mmol/L (ref 3.5–5.1)
Sodium: 127 mmol/L — ABNORMAL LOW (ref 135–145)
Total Bilirubin: 20.6 mg/dL (ref 0.3–1.2)
Total Protein: 6 g/dL — ABNORMAL LOW (ref 6.5–8.1)

## 2019-02-27 LAB — AMMONIA: Ammonia: 44 umol/L — ABNORMAL HIGH (ref 9–35)

## 2019-02-27 LAB — LIPASE, BLOOD: Lipase: 26 U/L (ref 11–51)

## 2019-02-27 MED ORDER — IOHEXOL 350 MG/ML SOLN
100.0000 mL | Freq: Once | INTRAVENOUS | Status: AC | PRN
  Administered 2019-02-27: 100 mL via INTRAVENOUS

## 2019-02-27 MED ORDER — CHLORPROMAZINE HCL 25 MG PO TABS
25.0000 mg | ORAL_TABLET | Freq: Once | ORAL | Status: AC
  Administered 2019-02-27: 21:00:00 25 mg via ORAL
  Filled 2019-02-27: qty 1

## 2019-02-27 MED ORDER — METOCLOPRAMIDE HCL 5 MG/ML IJ SOLN
10.0000 mg | Freq: Once | INTRAMUSCULAR | Status: AC
  Administered 2019-02-27: 23:00:00 10 mg via INTRAVENOUS
  Filled 2019-02-27: qty 2

## 2019-02-27 MED ORDER — METOCLOPRAMIDE HCL 10 MG PO TABS: 10.0000 mg | ORAL_TABLET | Freq: Four times a day (QID) | ORAL | 0 refills | Status: AC | PRN

## 2019-02-27 MED ORDER — DIPHENHYDRAMINE HCL 50 MG/ML IJ SOLN
25.0000 mg | Freq: Once | INTRAMUSCULAR | Status: AC
  Administered 2019-02-27: 25 mg via INTRAVENOUS
  Filled 2019-02-27: qty 1

## 2019-02-27 MED ORDER — CHLORPROMAZINE HCL 25 MG PO TABS: 25.0000 mg | ORAL_TABLET | Freq: Three times a day (TID) | ORAL | 0 refills | Status: AC | PRN

## 2019-02-27 NOTE — ED Notes (Signed)
Patient transported to CT 

## 2019-02-27 NOTE — ED Notes (Signed)
Date and time results received: 02/27/19 2200  Test: Total Bilirubin Critical Value: 20.6 mg/dL  Name of Provider Notified: Dr. Tyrone Nine  Orders Received? Or Actions Taken?: No orders at this time. Will continue to monitor.

## 2019-02-27 NOTE — ED Triage Notes (Signed)
Pt reports having continued hiccups since Thursday after having a liver biopsy. Pt is a cancer pt with [facility].

## 2019-02-27 NOTE — ED Provider Notes (Signed)
Patient signed out pending CT scan.  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  Recent liver biopsy on 02/25/2019.  Developed hiccups over the last 2 days.  Intractable.  Initially given Thorazine and subsequently given Reglan and Benadryl.  Lab work reviewed by prior MD and is at the patient's baseline.   11:51 PM CT scan reviewed independently by myself.  Patient with a small right-sided pleural effusion.  Additionally he has a small subcapsular hematoma which does not appear to be actively bleeding.  Hemoglobin is stable from chart review on 2/11 at 14.5.  On recheck, patient is resting.  He continues to have hiccups but feels that they are much improved with Reglan.  I discussed the results with the patient.  He has no evidence of active bleeding.  Suspect his hiccups are related to the subcapsular hematoma.  He has no upper respiratory symptoms to suggest pneumonia.  Pleural effusion may be related to known malignancy.  We will have him follow-up closely with his oncologist.  Will discharge with Reglan and Thorazine for hiccups.  Results for orders placed or performed during the hospital encounter of 02/27/19  CBC with Differential  Result Value Ref Range   WBC 12.7 (H) 4.0 - 10.5 K/uL   RBC 5.26 4.22 - 5.81 MIL/uL   Hemoglobin 14.5 13.0 - 17.0 g/dL   HCT 41.5 39.0 - 52.0 %   MCV 78.9 (L) 80.0 - 100.0 fL   MCH 27.6 26.0 - 34.0 pg   MCHC 34.9 30.0 - 36.0 g/dL   RDW 19.4 (H) 11.5 - 15.5 %   Platelets 178 150 - 400 K/uL   nRBC 0.0 0.0 - 0.2 %   Neutrophils Relative % 87 %   Neutro Abs 11.0 (H) 1.7 - 7.7 K/uL   Lymphocytes Relative 4 %   Lymphs Abs 0.5 (L) 0.7 - 4.0 K/uL   Monocytes Relative 8 %   Monocytes Absolute 1.0 0.1 - 1.0 K/uL   Eosinophils Relative 0 %   Eosinophils Absolute 0.0 0.0 - 0.5 K/uL   Basophils Relative 0 %   Basophils Absolute 0.0 0.0 - 0.1 K/uL   Immature Granulocytes 1 %   Abs Immature Granulocytes 0.11 (H) 0.00 - 0.07 K/uL   Comprehensive metabolic panel  Result Value Ref Range   Sodium 127 (L) 135 - 145 mmol/L   Potassium 4.8 3.5 - 5.1 mmol/L   Chloride 93 (L) 98 - 111 mmol/L   CO2 22 22 - 32 mmol/L   Glucose, Bld 148 (H) 70 - 99 mg/dL   BUN 14 6 - 20 mg/dL   Creatinine, Ser 0.50 (L) 0.61 - 1.24 mg/dL   Calcium 8.4 (L) 8.9 - 10.3 mg/dL   Total Protein 6.0 (L) 6.5 - 8.1 g/dL   Albumin 2.3 (L) 3.5 - 5.0 g/dL   AST 340 (H) 15 - 41 U/L   ALT 121 (H) 0 - 44 U/L   Alkaline Phosphatase 330 (H) 38 - 126 U/L   Total Bilirubin 20.6 (HH) 0.3 - 1.2 mg/dL   GFR calc non Af Amer >60 >60 mL/min   GFR calc Af Amer >60 >60 mL/min   Anion gap 12 5 - 15  Lipase, blood  Result Value Ref Range   Lipase 26 11 - 51 U/L  Ammonia  Result Value Ref Range   Ammonia 44 (H) 9 - 35 umol/L  Protime-INR  Result Value Ref Range   Prothrombin Time 22.0 (H) 11.4 -  15.2 seconds   INR 1.9 (H) 0.8 - 1.2  I-stat chem 8, ED (not at [facility] or [facility])  Result Value Ref Range   Sodium 129 (L) 135 - 145 mmol/L   Potassium 4.9 3.5 - 5.1 mmol/L   Chloride 93 (L) 98 - 111 mmol/L   BUN 13 6 - 20 mg/dL   Creatinine, Ser 0.60 (L) 0.61 - 1.24 mg/dL   Glucose, Bld 137 (H) 70 - 99 mg/dL   Calcium, Ion 1.10 (L) 1.15 - 1.40 mmol/L   TCO2 25 22 - 32 mmol/L   Hemoglobin 16.7 13.0 - 17.0 g/dL   HCT 49.0 39.0 - 52.0 %   CT Angio Chest PE W and/or Wo Contrast  Result Date: 02/27/2019 CLINICAL DATA:  Hiccups since liver biopsy history of liver cancer EXAM: CT ANGIOGRAPHY CHEST CT ABDOMEN AND PELVIS WITH CONTRAST TECHNIQUE: Multidetector CT imaging of the chest was performed using the standard protocol during bolus administration of intravenous contrast. Multiplanar CT image reconstructions and MIPs were obtained to evaluate the vascular anatomy. Multidetector CT imaging of the abdomen and pelvis was performed using the standard protocol during bolus administration of intravenous contrast. CONTRAST:  114mL OMNIPAQUE IOHEXOL 350 MG/ML SOLN COMPARISON:   None. FINDINGS: CTA CHEST FINDINGS Cardiovascular: Satisfactory opacification of the pulmonary arteries to the segmental level. No evidence of pulmonary embolism. Normal heart size. No pericardial effusion. Nonaneurysmal aorta. Mediastinum/Nodes: No enlarged mediastinal, hilar, or axillary lymph nodes. Thyroid gland, trachea, and esophagus demonstrate no significant findings. Lungs/Pleura: Small-moderate right pleural effusion with passive atelectasis or pneumonia in the right lower lobe. Musculoskeletal: Mild superior endplate deformity with Schmorl's node at T9 and T11. Review of the MIP images confirms the above findings. CT ABDOMEN and PELVIS FINDINGS Hepatobiliary: Lobulated liver contour. Diffusely heterogeneous liver parenchyma presumably due to history of metastatic liver disease and post treatment changes, no previous images available for comparison. Indistinct inferior right hepatic lobe margin with moderate subcapsular fluid. Scattered hyperdensities within the subcapsular fluid of the right hepatic lobe, likely representing blood products/hematoma. Tubular high density structure at the porta hepatis may represent a contracted calcified gallbladder. No significant extrahepatic biliary dilatation. Pancreas: Unremarkable. No pancreatic ductal dilatation or surrounding inflammatory changes. Spleen: Normal in size without focal abnormality. Adrenals/Urinary Tract: Adrenal glands are unremarkable. Kidneys are normal, without renal calculi, focal lesion, or hydronephrosis. Bladder is unremarkable. Stomach/Bowel: Stomach is within normal limits. No evidence of bowel wall thickening, distention, or inflammatory changes. Vascular/Lymphatic: Nonaneurysmal aorta. Multiple subcentimeter retroperitoneal nodes. Prominent mesenteric root and right lower quadrant lymph nodes measuring up to 1 cm. Possible small focus of thrombus within distal right portal vessel versus focally ectatic bile duct, series 2, image number  13. Reproductive: Prostate is unremarkable. Other: No free air. Moderate free fluid in the pelvis. Small amount of perihepatic ascites. Generalized subcutaneous edema. Hazy infiltration throughout the mesentery. Coils versus suture at the porta hepatis. Musculoskeletal: No acute or suspicious osseous abnormality Review of the MIP images confirms the above findings. IMPRESSION: 1. Negative for acute pulmonary embolus. 2. Small moderate right pleural effusion with passive atelectasis or pneumonia in the right lower lobe. 3. Grossly abnormal liver with diffuse heterogeneity, most evident in the right hepatic lobe, presumably due to the history of metastatic disease and post treatment change. Poorly defined inferior right hepatic margin with moderate subcapsular hematoma at the inferior right hepatic lobe, potentially related to recent history of liver biopsy. No definite acute extravasation/active bleeding. 4. Small focal linear hypodensity within the right hepatic  lobe, either representing small amount of distal portal vein thrombus versus focal duct ectasia. 5. Generalized anasarca. Diffuse hazy appearance of the mesentery which may be secondary to mesenteric edema. Multiple borderline to slightly enlarged mesenteric nodes, cannot exclude metastatic disease. Electronically Signed   By: Donavan Foil M.D.   On: 02/27/2019 23:04   CT ABDOMEN PELVIS W CONTRAST  Result Date: 02/27/2019 CLINICAL DATA:  Hiccups since liver biopsy history of liver cancer EXAM: CT ANGIOGRAPHY CHEST CT ABDOMEN AND PELVIS WITH CONTRAST TECHNIQUE: Multidetector CT imaging of the chest was performed using the standard protocol during bolus administration of intravenous contrast. Multiplanar CT image reconstructions and MIPs were obtained to evaluate the vascular anatomy. Multidetector CT imaging of the abdomen and pelvis was performed using the standard protocol during bolus administration of intravenous contrast. CONTRAST:  182mL OMNIPAQUE  IOHEXOL 350 MG/ML SOLN COMPARISON:  None. FINDINGS: CTA CHEST FINDINGS Cardiovascular: Satisfactory opacification of the pulmonary arteries to the segmental level. No evidence of pulmonary embolism. Normal heart size. No pericardial effusion. Nonaneurysmal aorta. Mediastinum/Nodes: No enlarged mediastinal, hilar, or axillary lymph nodes. Thyroid gland, trachea, and esophagus demonstrate no significant findings. Lungs/Pleura: Small-moderate right pleural effusion with passive atelectasis or pneumonia in the right lower lobe. Musculoskeletal: Mild superior endplate deformity with Schmorl's node at T9 and T11. Review of the MIP images confirms the above findings. CT ABDOMEN and PELVIS FINDINGS Hepatobiliary: Lobulated liver contour. Diffusely heterogeneous liver parenchyma presumably due to history of metastatic liver disease and post treatment changes, no previous images available for comparison. Indistinct inferior right hepatic lobe margin with moderate subcapsular fluid. Scattered hyperdensities within the subcapsular fluid of the right hepatic lobe, likely representing blood products/hematoma. Tubular high density structure at the porta hepatis may represent a contracted calcified gallbladder. No significant extrahepatic biliary dilatation. Pancreas: Unremarkable. No pancreatic ductal dilatation or surrounding inflammatory changes. Spleen: Normal in size without focal abnormality. Adrenals/Urinary Tract: Adrenal glands are unremarkable. Kidneys are normal, without renal calculi, focal lesion, or hydronephrosis. Bladder is unremarkable. Stomach/Bowel: Stomach is within normal limits. No evidence of bowel wall thickening, distention, or inflammatory changes. Vascular/Lymphatic: Nonaneurysmal aorta. Multiple subcentimeter retroperitoneal nodes. Prominent mesenteric root and right lower quadrant lymph nodes measuring up to 1 cm. Possible small focus of thrombus within distal right portal vessel versus focally ectatic  bile duct, series 2, image number 13. Reproductive: Prostate is unremarkable. Other: No free air. Moderate free fluid in the pelvis. Small amount of perihepatic ascites. Generalized subcutaneous edema. Hazy infiltration throughout the mesentery. Coils versus suture at the porta hepatis. Musculoskeletal: No acute or suspicious osseous abnormality Review of the MIP images confirms the above findings. IMPRESSION: 1. Negative for acute pulmonary embolus. 2. Small moderate right pleural effusion with passive atelectasis or pneumonia in the right lower lobe. 3. Grossly abnormal liver with diffuse heterogeneity, most evident in the right hepatic lobe, presumably due to the history of metastatic disease and post treatment change. Poorly defined inferior right hepatic margin with moderate subcapsular hematoma at the inferior right hepatic lobe, potentially related to recent history of liver biopsy. No definite acute extravasation/active bleeding. 4. Small focal linear hypodensity within the right hepatic lobe, either representing small amount of distal portal vein thrombus versus focal duct ectasia. 5. Generalized anasarca. Diffuse hazy appearance of the mesentery which may be secondary to mesenteric edema. Multiple borderline to slightly enlarged mesenteric nodes, cannot exclude metastatic disease. Electronically Signed   By: Donavan Foil M.D.   On: 02/27/2019 23:04   Problem List Items Addressed  This Visit    None    Visit Diagnoses    Hiccups    -  Primary   Subcapsular hematoma of liver       Pleural effusion            Dina Rich Barbette Hair, MD 02/27/19 2353

## 2019-02-27 NOTE — ED Provider Notes (Signed)
[facility] DEPT Provider Note   CSN: [id number] Arrival date & time: 02/27/19  1958     History Chief Complaint  Patient presents with  . Hiccups    Arthur Park is a 39 y.o. male.  39 yo M with a chief complaint of hiccups.  Going on for about 48 hours now.  Patient had had some hiccups off and on for the past couple weeks.  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  Is gone through immuno modulatory therapy and Y-90 radiation procedures.  He had some improvements of the actual size of the lesions on imaging but had escalating bilirubin.  Has had to hold off on his therapy due to that.  Had a biopsy to try and see what the cause of his bilirubin escalation was.  Has had some cough usually when he lays back flat for the past few days.  The history is provided by the patient.  Illness Severity:  Moderate Onset quality:  Gradual Duration:  2 days Timing:  Constant Progression:  Worsening Chronicity:  New Associated symptoms: cough   Associated symptoms: no abdominal pain, no chest pain, no congestion, no diarrhea, no fever, no headaches, no myalgias, no rash, no shortness of breath and no vomiting        Past Medical History:  Diagnosis Date  . Anal warts   . Anxiety   . Cancer ([place])   . Depression   . Wears glasses     There are no problems to display for this patient.   Past Surgical History:  Procedure Laterality Date  . LASER ABLATION CONDOLAMATA N/A 02/22/2013   Procedure: EXAM UNDER ANESTHESIA, LASER FULGURATION OF ANAL CANAL WARTS;  Surgeon: Gayland Curry, MD;  Location: [facility];  Service: General;  Laterality: N/A;       Family History  Problem Relation Age of Onset  . Depression Mother   . Drug abuse Brother   . Cancer Father        prostate & thyroid    Social History   Tobacco Use  . Smoking status: Never Smoker  . Smokeless tobacco: Never Used  Substance Use  Topics  . Alcohol use: Yes    Comment: OCCASIONAL  . Drug use: No    Home Medications Prior to Admission medications   Medication Sig Start Date End Date Taking? Authorizing Provider  AMBULATORY NON FORMULARY MEDICATION Apply 1 drop topically 2 (two) times daily. Medication Name: Nifedipine 2% ointment Apply liberal amount around anus twice a day 03/24/13   Greer Pickerel, MD  buPROPion (WELLBUTRIN XL) 300 MG 24 hr tablet Take 300 mg by mouth every morning.     [provider]  chlorproMAZINE (THORAZINE) 25 MG tablet Take 1 tablet (25 mg total) by mouth 3 (three) times daily as needed for hiccoughs. 02/27/19   Horton, Barbette Hair, MD  gabapentin (NEURONTIN) 300 MG capsule Take 300 mg by mouth 3 (three) times daily.    [provider]  metoCLOPramide (REGLAN) 10 MG tablet Take 1 tablet (10 mg total) by mouth every 6 (six) hours as needed for nausea. 02/27/19   Horton, Barbette Hair, MD  Multiple Vitamins-Minerals (MULTIVITAMIN WITH MINERALS) tablet Take 1 tablet by mouth daily.    [provider]    Allergies    Patient has no known allergies.  Review of Systems   Review of Systems  Constitutional: Negative for chills and fever.  HENT: Negative for  congestion and facial swelling.   Eyes: Negative for discharge and visual disturbance.  Respiratory: Positive for cough. Negative for shortness of breath.   Cardiovascular: Negative for chest pain and palpitations.  Gastrointestinal: Negative for abdominal pain, diarrhea and vomiting.  Musculoskeletal: Negative for arthralgias and myalgias.  Skin: Positive for color change. Negative for rash.  Neurological: Negative for tremors, syncope and headaches.  Psychiatric/Behavioral: Negative for confusion and dysphoric mood.    Physical Exam Updated Vital Signs BP 97/60 (BP Location: Right Arm)   Pulse 98   Temp 98.1 F (36.7 C) (Oral)   Resp 18   Ht 5\' 10"  (1.778 m)   Wt 74.8 kg   SpO2 94%   BMI 23.68 kg/m    Physical Exam Vitals and nursing note reviewed.  Constitutional:      Appearance: He is well-developed.  HENT:     Head: Normocephalic and atraumatic.  Eyes:     Pupils: Pupils are equal, round, and reactive to light.  Neck:     Vascular: No JVD.  Cardiovascular:     Rate and Rhythm: Normal rate and regular rhythm.     Heart sounds: No murmur. No friction rub. No gallop.   Pulmonary:     Effort: No respiratory distress.     Breath sounds: No wheezing.  Abdominal:     General: There is distension.     Tenderness: There is no abdominal tenderness. There is no guarding or rebound.  Musculoskeletal:        General: Normal range of motion.     Cervical back: Normal range of motion and neck supple.     Right lower leg: Edema present.     Left lower leg: Edema present.     Comments: 2+ to BLE   Skin:    Coloration: Skin is not pale.     Findings: No rash.  Neurological:     Mental Status: He is alert and oriented to person, place, and time.  Psychiatric:        Behavior: Behavior normal.     ED Results / Procedures / Treatments   Labs (all labs ordered are listed, but only abnormal results are displayed) Labs Reviewed  CBC WITH DIFFERENTIAL/PLATELET - Abnormal; Notable for the following components:      Result Value   WBC 12.7 (*)    MCV 78.9 (*)    RDW 19.4 (*)    Neutro Abs 11.0 (*)    Lymphs Abs 0.5 (*)    Abs Immature Granulocytes 0.11 (*)    All other components within normal limits  COMPREHENSIVE METABOLIC PANEL - Abnormal; Notable for the following components:   Sodium 127 (*)    Chloride 93 (*)    Glucose, Bld 148 (*)    Creatinine, Ser 0.50 (*)    Calcium 8.4 (*)    Total Protein 6.0 (*)    Albumin 2.3 (*)    AST 340 (*)    ALT 121 (*)    Alkaline Phosphatase 330 (*)    Total Bilirubin 20.6 (*)    All other components within normal limits  AMMONIA - Abnormal; Notable for the following components:   Ammonia 44 (*)    All other components within  normal limits  PROTIME-INR - Abnormal; Notable for the following components:   Prothrombin Time 22.0 (*)    INR 1.9 (*)    All other components within normal limits  I-STAT CHEM 8, ED - Abnormal; Notable for the following components:  Sodium 129 (*)    Chloride 93 (*)    Creatinine, Ser 0.60 (*)    Glucose, Bld 137 (*)    Calcium, Ion 1.10 (*)    All other components within normal limits  LIPASE, BLOOD    EKG None  Radiology CT Angio Chest PE W and/or Wo Contrast  Result Date: 02/27/2019 CLINICAL DATA:  Hiccups since liver biopsy history of liver cancer EXAM: CT ANGIOGRAPHY CHEST CT ABDOMEN AND PELVIS WITH CONTRAST TECHNIQUE: Multidetector CT imaging of the chest was performed using the standard protocol during bolus administration of intravenous contrast. Multiplanar CT image reconstructions and MIPs were obtained to evaluate the vascular anatomy. Multidetector CT imaging of the abdomen and pelvis was performed using the standard protocol during bolus administration of intravenous contrast. CONTRAST:  133mL OMNIPAQUE IOHEXOL 350 MG/ML SOLN COMPARISON:  None. FINDINGS: CTA CHEST FINDINGS Cardiovascular: Satisfactory opacification of the pulmonary arteries to the segmental level. No evidence of pulmonary embolism. Normal heart size. No pericardial effusion. Nonaneurysmal aorta. Mediastinum/Nodes: No enlarged mediastinal, hilar, or axillary lymph nodes. Thyroid gland, trachea, and esophagus demonstrate no significant findings. Lungs/Pleura: Small-moderate right pleural effusion with passive atelectasis or pneumonia in the right lower lobe. Musculoskeletal: Mild superior endplate deformity with Schmorl's node at T9 and T11. Review of the MIP images confirms the above findings. CT ABDOMEN and PELVIS FINDINGS Hepatobiliary: Lobulated liver contour. Diffusely heterogeneous liver parenchyma presumably due to history of metastatic liver disease and post treatment changes, no previous images available  for comparison. Indistinct inferior right hepatic lobe margin with moderate subcapsular fluid. Scattered hyperdensities within the subcapsular fluid of the right hepatic lobe, likely representing blood products/hematoma. Tubular high density structure at the porta hepatis may represent a contracted calcified gallbladder. No significant extrahepatic biliary dilatation. Pancreas: Unremarkable. No pancreatic ductal dilatation or surrounding inflammatory changes. Spleen: Normal in size without focal abnormality. Adrenals/Urinary Tract: Adrenal glands are unremarkable. Kidneys are normal, without renal calculi, focal lesion, or hydronephrosis. Bladder is unremarkable. Stomach/Bowel: Stomach is within normal limits. No evidence of bowel wall thickening, distention, or inflammatory changes. Vascular/Lymphatic: Nonaneurysmal aorta. Multiple subcentimeter retroperitoneal nodes. Prominent mesenteric root and right lower quadrant lymph nodes measuring up to 1 cm. Possible small focus of thrombus within distal right portal vessel versus focally ectatic bile duct, series 2, image number 13. Reproductive: Prostate is unremarkable. Other: No free air. Moderate free fluid in the pelvis. Small amount of perihepatic ascites. Generalized subcutaneous edema. Hazy infiltration throughout the mesentery. Coils versus suture at the porta hepatis. Musculoskeletal: No acute or suspicious osseous abnormality Review of the MIP images confirms the above findings. IMPRESSION: 1. Negative for acute pulmonary embolus. 2. Small moderate right pleural effusion with passive atelectasis or pneumonia in the right lower lobe. 3. Grossly abnormal liver with diffuse heterogeneity, most evident in the right hepatic lobe, presumably due to the history of metastatic disease and post treatment change. Poorly defined inferior right hepatic margin with moderate subcapsular hematoma at the inferior right hepatic lobe, potentially related to recent history of  liver biopsy. No definite acute extravasation/active bleeding. 4. Small focal linear hypodensity within the right hepatic lobe, either representing small amount of distal portal vein thrombus versus focal duct ectasia. 5. Generalized anasarca. Diffuse hazy appearance of the mesentery which may be secondary to mesenteric edema. Multiple borderline to slightly enlarged mesenteric nodes, cannot exclude metastatic disease. Electronically Signed   By: Donavan Foil M.D.   On: 02/27/2019 23:04   CT ABDOMEN PELVIS W CONTRAST  Result Date: 02/27/2019  CLINICAL DATA:  Hiccups since liver biopsy history of liver cancer EXAM: CT ANGIOGRAPHY CHEST CT ABDOMEN AND PELVIS WITH CONTRAST TECHNIQUE: Multidetector CT imaging of the chest was performed using the standard protocol during bolus administration of intravenous contrast. Multiplanar CT image reconstructions and MIPs were obtained to evaluate the vascular anatomy. Multidetector CT imaging of the abdomen and pelvis was performed using the standard protocol during bolus administration of intravenous contrast. CONTRAST:  132mL OMNIPAQUE IOHEXOL 350 MG/ML SOLN COMPARISON:  None. FINDINGS: CTA CHEST FINDINGS Cardiovascular: Satisfactory opacification of the pulmonary arteries to the segmental level. No evidence of pulmonary embolism. Normal heart size. No pericardial effusion. Nonaneurysmal aorta. Mediastinum/Nodes: No enlarged mediastinal, hilar, or axillary lymph nodes. Thyroid gland, trachea, and esophagus demonstrate no significant findings. Lungs/Pleura: Small-moderate right pleural effusion with passive atelectasis or pneumonia in the right lower lobe. Musculoskeletal: Mild superior endplate deformity with Schmorl's node at T9 and T11. Review of the MIP images confirms the above findings. CT ABDOMEN and PELVIS FINDINGS Hepatobiliary: Lobulated liver contour. Diffusely heterogeneous liver parenchyma presumably due to history of metastatic liver disease and post treatment  changes, no previous images available for comparison. Indistinct inferior right hepatic lobe margin with moderate subcapsular fluid. Scattered hyperdensities within the subcapsular fluid of the right hepatic lobe, likely representing blood products/hematoma. Tubular high density structure at the porta hepatis may represent a contracted calcified gallbladder. No significant extrahepatic biliary dilatation. Pancreas: Unremarkable. No pancreatic ductal dilatation or surrounding inflammatory changes. Spleen: Normal in size without focal abnormality. Adrenals/Urinary Tract: Adrenal glands are unremarkable. Kidneys are normal, without renal calculi, focal lesion, or hydronephrosis. Bladder is unremarkable. Stomach/Bowel: Stomach is within normal limits. No evidence of bowel wall thickening, distention, or inflammatory changes. Vascular/Lymphatic: Nonaneurysmal aorta. Multiple subcentimeter retroperitoneal nodes. Prominent mesenteric root and right lower quadrant lymph nodes measuring up to 1 cm. Possible small focus of thrombus within distal right portal vessel versus focally ectatic bile duct, series 2, image number 13. Reproductive: Prostate is unremarkable. Other: No free air. Moderate free fluid in the pelvis. Small amount of perihepatic ascites. Generalized subcutaneous edema. Hazy infiltration throughout the mesentery. Coils versus suture at the porta hepatis. Musculoskeletal: No acute or suspicious osseous abnormality Review of the MIP images confirms the above findings. IMPRESSION: 1. Negative for acute pulmonary embolus. 2. Small moderate right pleural effusion with passive atelectasis or pneumonia in the right lower lobe. 3. Grossly abnormal liver with diffuse heterogeneity, most evident in the right hepatic lobe, presumably due to the history of metastatic disease and post treatment change. Poorly defined inferior right hepatic margin with moderate subcapsular hematoma at the inferior right hepatic lobe,  potentially related to recent history of liver biopsy. No definite acute extravasation/active bleeding. 4. Small focal linear hypodensity within the right hepatic lobe, either representing small amount of distal portal vein thrombus versus focal duct ectasia. 5. Generalized anasarca. Diffuse hazy appearance of the mesentery which may be secondary to mesenteric edema. Multiple borderline to slightly enlarged mesenteric nodes, cannot exclude metastatic disease. Electronically Signed   By: Donavan Foil M.D.   On: 02/27/2019 23:04    Procedures Procedures (including critical care time)  Medications Ordered in ED Medications  chlorproMAZINE (THORAZINE) tablet 25 mg (25 mg Oral Given 02/27/19 2101)  iohexol (OMNIPAQUE) 350 MG/ML injection 100 mL (100 mLs Intravenous Contrast Given 02/27/19 2148)  metoCLOPramide (REGLAN) injection 10 mg (10 mg Intravenous Given 02/27/19 2257)  diphenhydrAMINE (BENADRYL) injection 25 mg (25 mg Intravenous Given 02/27/19 2257)    ED Course  I have reviewed the triage vital signs and the nursing notes.  Pertinent labs & imaging results that were available during my care of the patient were reviewed by me and considered in my medical decision making (see chart for details).    MDM Rules/Calculators/A&P                      39 yo M with a chief complaints of hiccups.  Going on for about 48 hours now.  Patient had a liver biopsy done for a lesion that has increased in size.  Thought to be ocular melanoma metastasis.  He had called his radiation oncologist who told him to come here for imaging to evaluate for hematoma or abscess that could be causing his symptoms.  Will obtain a laboratory evaluation and CT scan of the chest abdomen pelvis.  Labwork similar to 2/11 at [facility].  Patient having continued hiccups, given reglan.  Signed out to Dr. Dina Rich, please see their note for further details of care in the ED.   The patients results and plan were reviewed and discussed.     Any x-rays performed were independently reviewed by myself.   Differential diagnosis were considered with the presenting HPI.  Medications  chlorproMAZINE (THORAZINE) tablet 25 mg (25 mg Oral Given 02/27/19 2101)  iohexol (OMNIPAQUE) 350 MG/ML injection 100 mL (100 mLs Intravenous Contrast Given 02/27/19 2148)  metoCLOPramide (REGLAN) injection 10 mg (10 mg Intravenous Given 02/27/19 2257)  diphenhydrAMINE (BENADRYL) injection 25 mg (25 mg Intravenous Given 02/27/19 2257)    Vitals:   02/27/19 2245 02/27/19 2300 02/28/19 0000 02/28/19 0012  BP: (!) 103/59 116/63 97/60 97/60   Pulse:  (!) 110  98  Resp:  18  18  Temp:    98.1 F (36.7 C)  TempSrc:    Oral  SpO2:  94%  94%  Weight:      Height:        Final diagnoses:  Hiccups  Subcapsular hematoma of liver  Pleural effusion  Singultus    Final Clinical Impression(s) / ED Diagnoses Final diagnoses:  Hiccups  Subcapsular hematoma of liver  Pleural effusion  Singultus    Rx / DC Orders ED Discharge Orders         Ordered    chlorproMAZINE (THORAZINE) 25 MG tablet  3 times daily PRN     02/27/19 2349    metoCLOPramide (REGLAN) 10 MG tablet  Every 6 hours PRN     02/27/19 [place], [place], DO 02/28/19 1504

## 2019-02-27 NOTE — Discharge Instructions (Addendum)
You were seen today for hiccups.  You have a small right-sided pleural effusion and a small subcapsular hematoma.  There is no evidence of active bleeding.  This is likely related to your recent biopsy.  Your hemoglobin is stable.  This is very likely the cause of your hiccups.  Take Reglan or Thorazine, whichever helps most and follow-up closely with your primary oncologist at [facility].

## 2019-03-15 DEATH — deceased

## 2021-08-08 IMAGING — CT CT ABD-PELV W/ CM
2 of 4 series · 14 of 46 positions shown, 16 images · IV contrast (omnipaque)
Comparison: None.

CLINICAL DATA: Hiccups since liver biopsy history of liver cancer

EXAM:
CT ANGIOGRAPHY CHEST
CT ABDOMEN AND PELVIS WITH CONTRAST
TECHNIQUE: Multidetector CT imaging of the chest was performed using the
standard protocol during bolus administration of intravenous
contrast. Multiplanar CT image reconstructions and MIPs were
obtained to evaluate the vascular anatomy. Multidetector CT imaging
of the abdomen and pelvis was performed using the standard protocol
during bolus administration of intravenous contrast.
CONTRAST:  100mL OMNIPAQUE IOHEXOL 350 MG/ML SOLN

[Series 2: axial st · axial · 0.83mm/px · z∈[-615,-175]mm · 11 of 98 slices shown, 13 images]
[im 5/98  soft-tissue]
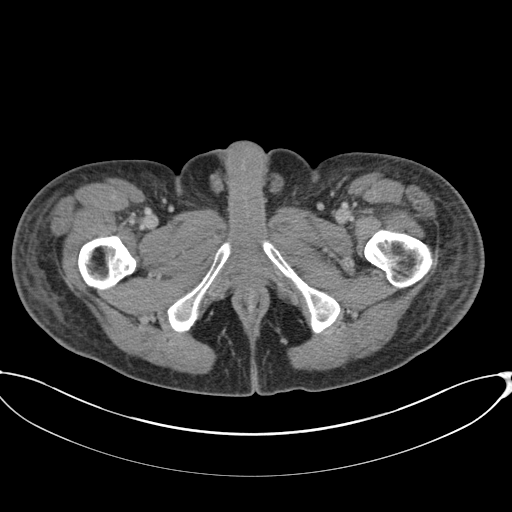
[im 5/98  bone]
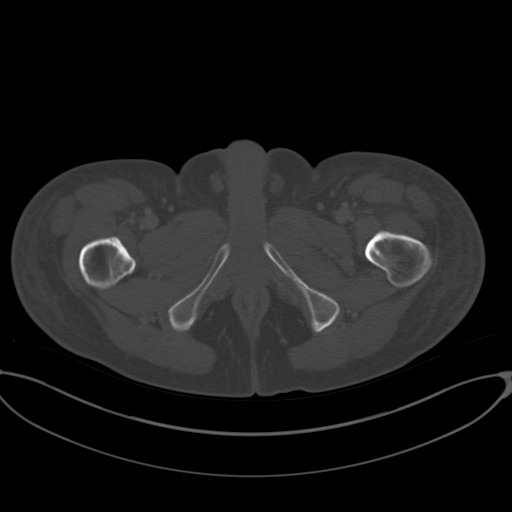
[im 15/98  soft-tissue]
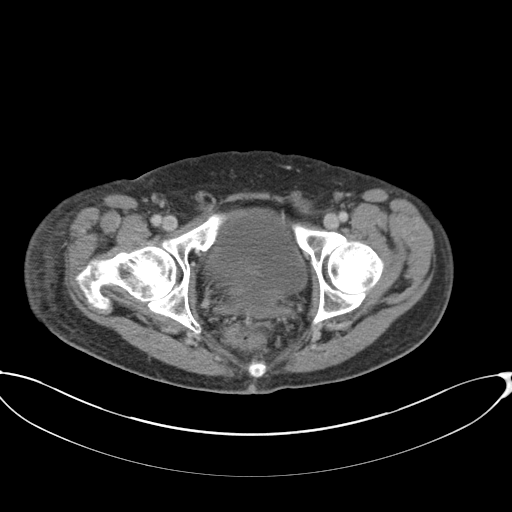
[im 25/98  soft-tissue]
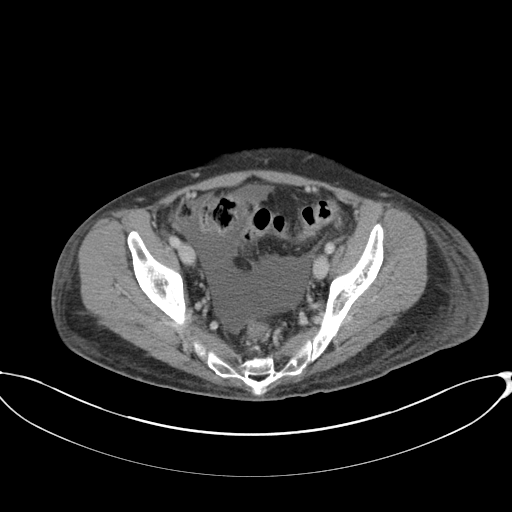
[im 34/98  soft-tissue]
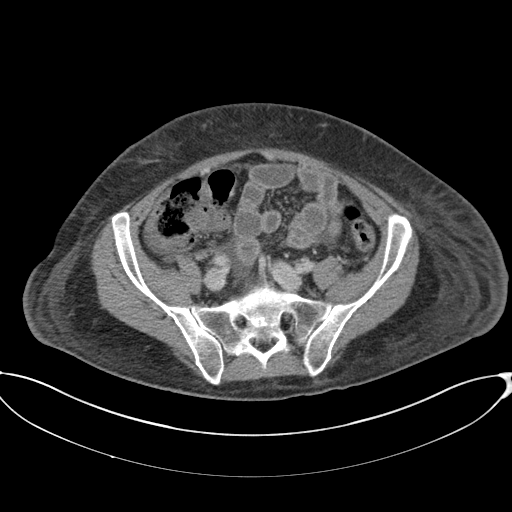
[im 39/98  soft-tissue]
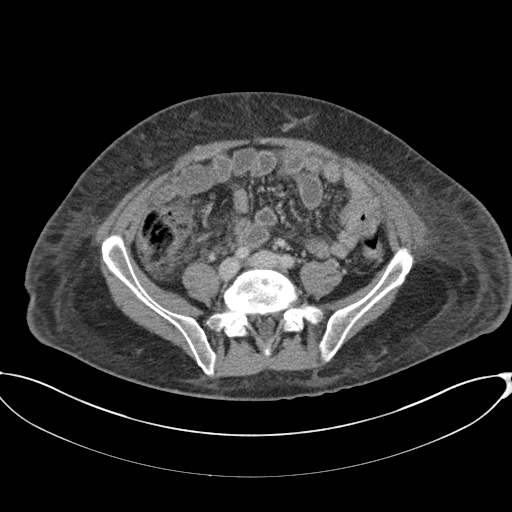
[im 49/98  soft-tissue]
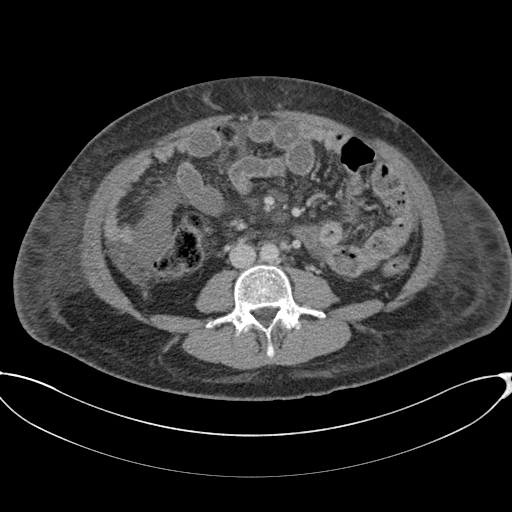
[im 59/98  soft-tissue]
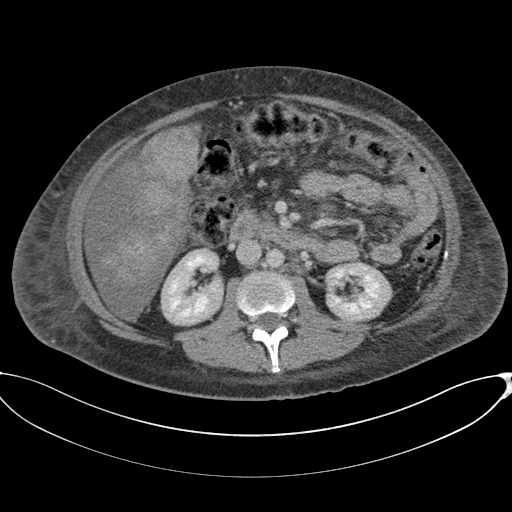
[im 64/98  soft-tissue]
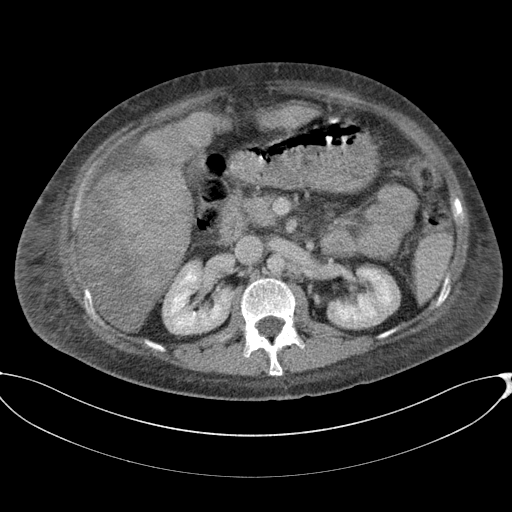
[im 73/98  soft-tissue]
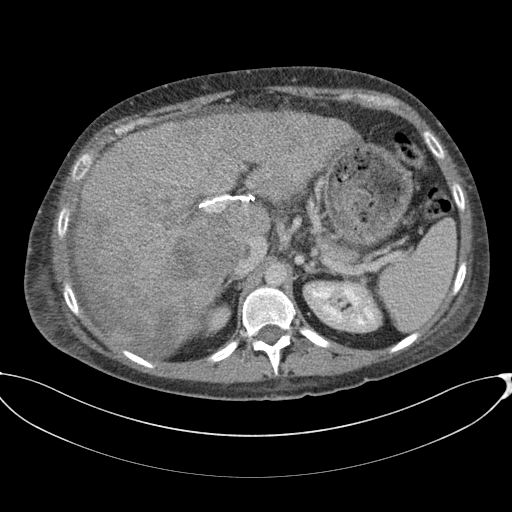
[im 73/98  bone]
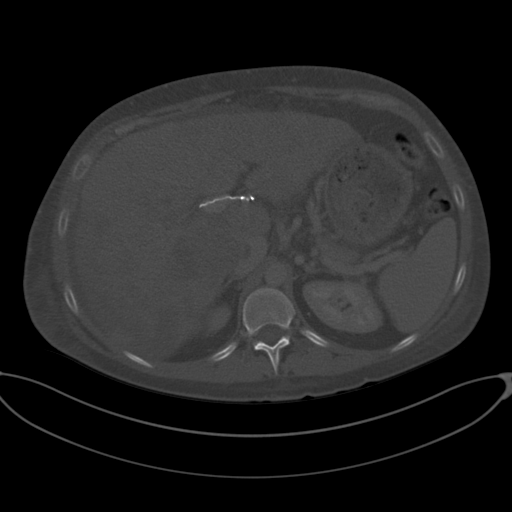
[im 83/98  soft-tissue]
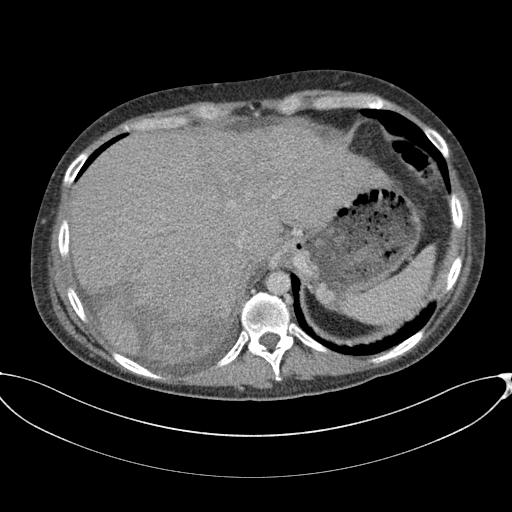
[im 93/98  soft-tissue]
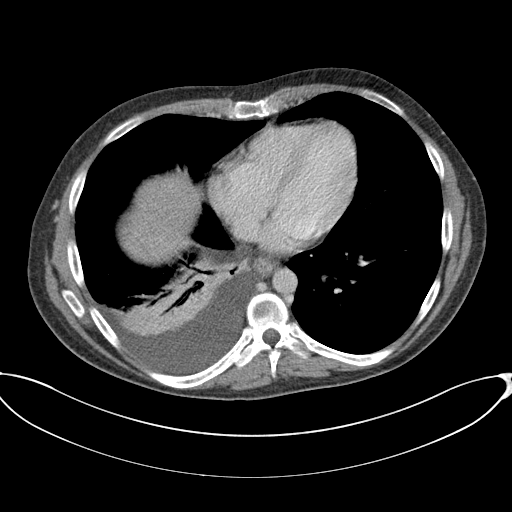

[Series 5: coronal st · coronal · 0.83mm/px · 3 of 105 slices shown]
[im 35/105  soft-tissue]
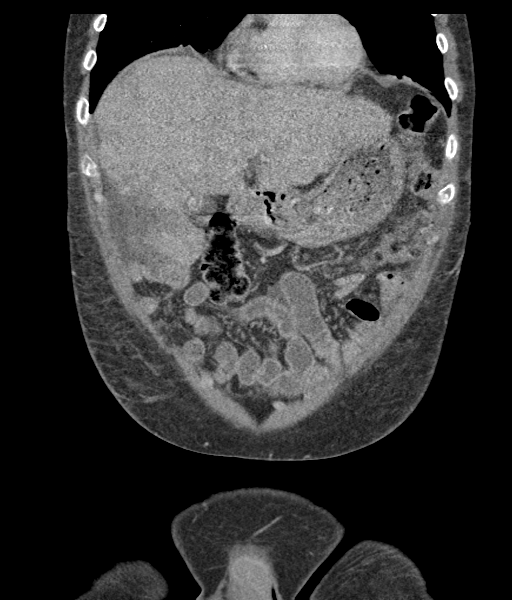
[im 47/105  soft-tissue]
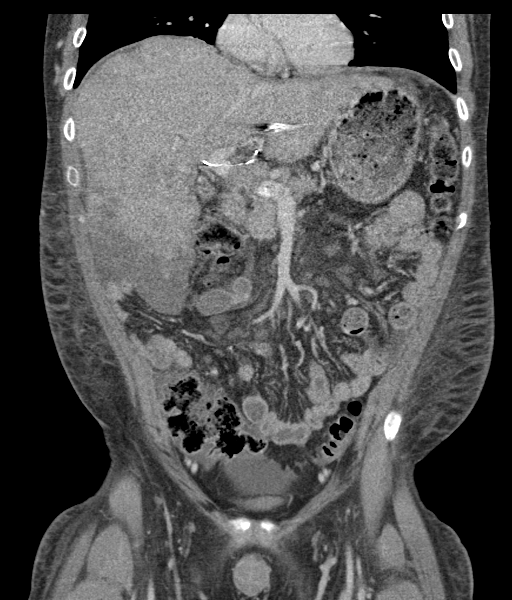
[im 58/105  soft-tissue]
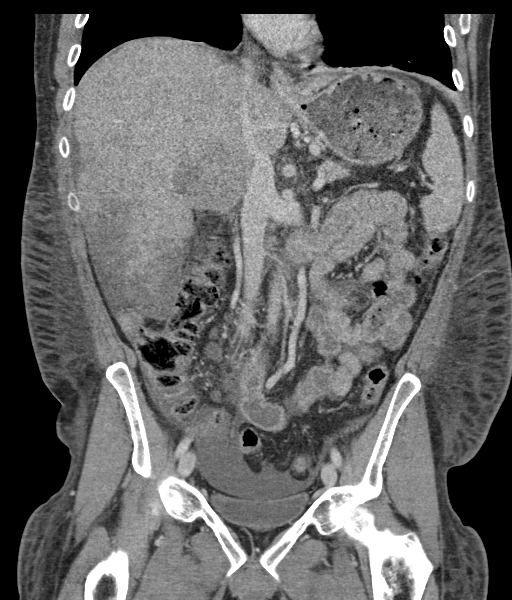

[14 of 46 positions shown; findings below may reference images not displayed]

FINDINGS: CTA CHEST FINDINGS

Cardiovascular: Satisfactory opacification of the pulmonary arteries
to the segmental level. No evidence of pulmonary embolism. Normal
heart size. No pericardial effusion. Nonaneurysmal aorta.

Mediastinum/Nodes: No enlarged mediastinal, hilar, or axillary lymph
nodes. Thyroid gland, trachea, and esophagus demonstrate no
significant findings.

Lungs/Pleura: Small-moderate right pleural effusion with passive
atelectasis or pneumonia in the right lower lobe.

Musculoskeletal: Mild superior endplate deformity with Schmorl's
node at T9 and T11.

Review of the MIP images confirms the above findings.

CT ABDOMEN and PELVIS FINDINGS

Hepatobiliary: Lobulated liver contour. Diffusely heterogeneous
liver parenchyma presumably due to history of metastatic liver
disease and post treatment changes, no previous images available for
comparison. Indistinct inferior right hepatic lobe margin with
moderate subcapsular fluid. Scattered hyperdensities within the
subcapsular fluid of the right hepatic lobe, likely representing
blood products/hematoma. Tubular high density structure at the porta
hepatis may represent a contracted calcified gallbladder. No
significant extrahepatic biliary dilatation.

Pancreas: Unremarkable. No pancreatic ductal dilatation or
surrounding inflammatory changes.

Spleen: Normal in size without focal abnormality.

Adrenals/Urinary Tract: Adrenal glands are unremarkable. Kidneys are
normal, without renal calculi, focal lesion, or hydronephrosis.
Bladder is unremarkable.

Stomach/Bowel: Stomach is within normal limits. No evidence of bowel
wall thickening, distention, or inflammatory changes.

Vascular/Lymphatic: Nonaneurysmal aorta. Multiple subcentimeter
retroperitoneal nodes. Prominent mesenteric root and right lower
quadrant lymph nodes measuring up to 1 cm. Possible small focus of
thrombus within distal right portal vessel versus focally ectatic
bile duct, series 2, image number 13.

Reproductive: Prostate is unremarkable.

Other: No free air. Moderate free fluid in the pelvis. Small amount
of perihepatic ascites. Generalized subcutaneous edema. Hazy
infiltration throughout the mesentery. Coils versus suture at the
porta hepatis.

Musculoskeletal: No acute or suspicious osseous abnormality

Review of the MIP images confirms the above findings.
IMPRESSION: 1. Negative for acute pulmonary embolus.
2. Small moderate right pleural effusion with passive atelectasis or
pneumonia in the right lower lobe.
3. Grossly abnormal liver with diffuse heterogeneity, most evident
in the right hepatic lobe, presumably due to the history of
metastatic disease and post treatment change. Poorly defined
inferior right hepatic margin with moderate subcapsular hematoma at
the inferior right hepatic lobe, potentially related to recent
history of liver biopsy. No definite acute extravasation/active
bleeding.
4. Small focal linear hypodensity within the right hepatic lobe,
either representing small amount of distal portal vein thrombus
versus focal duct ectasia.
5. Generalized anasarca. Diffuse hazy appearance of the mesentery
which may be secondary to mesenteric edema. Multiple borderline to
slightly enlarged mesenteric nodes, cannot exclude metastatic
disease.
